# Patient Record
Sex: Male | Born: 1980 | Race: Black or African American | Hispanic: No | Marital: Single | Smoking: Current every day smoker
Health system: Southern US, Community
[De-identification: ages and names within clinical notes are randomized; demographics above are authoritative.]

## PROBLEM LIST (undated history)

## (undated) DIAGNOSIS — M5136 Other intervertebral disc degeneration, lumbar region: Secondary | ICD-10-CM

## (undated) DIAGNOSIS — F17200 Nicotine dependence, unspecified, uncomplicated: Secondary | ICD-10-CM

## (undated) DIAGNOSIS — G709 Myoneural disorder, unspecified: Secondary | ICD-10-CM

## (undated) DIAGNOSIS — F1410 Cocaine abuse, uncomplicated: Secondary | ICD-10-CM

## (undated) DIAGNOSIS — F1210 Cannabis abuse, uncomplicated: Secondary | ICD-10-CM

## (undated) DIAGNOSIS — M51369 Other intervertebral disc degeneration, lumbar region without mention of lumbar back pain or lower extremity pain: Secondary | ICD-10-CM

## (undated) DIAGNOSIS — E669 Obesity, unspecified: Secondary | ICD-10-CM

## (undated) DIAGNOSIS — I10 Essential (primary) hypertension: Secondary | ICD-10-CM

## (undated) DIAGNOSIS — E119 Type 2 diabetes mellitus without complications: Secondary | ICD-10-CM

## (undated) DIAGNOSIS — K219 Gastro-esophageal reflux disease without esophagitis: Secondary | ICD-10-CM

## (undated) DIAGNOSIS — E785 Hyperlipidemia, unspecified: Secondary | ICD-10-CM

## (undated) HISTORY — DX: Cocaine abuse, uncomplicated: F14.10

## (undated) HISTORY — DX: Gastro-esophageal reflux disease without esophagitis: K21.9

## (undated) HISTORY — DX: Hyperlipidemia, unspecified: E78.5

## (undated) HISTORY — DX: Myoneural disorder, unspecified: G70.9

## (undated) HISTORY — DX: Other intervertebral disc degeneration, lumbar region without mention of lumbar back pain or lower extremity pain: M51.369

## (undated) HISTORY — DX: Cannabis abuse, uncomplicated: F12.10

## (undated) HISTORY — DX: Nicotine dependence, unspecified, uncomplicated: F17.200

## (undated) HISTORY — PX: NO PAST SURGERIES: SHX2092

## (undated) HISTORY — DX: Obesity, unspecified: E66.9

## (undated) HISTORY — DX: Other intervertebral disc degeneration, lumbar region: M51.36

---

## 2001-01-24 ENCOUNTER — Emergency Department (HOSPITAL_COMMUNITY): Admission: EM | Admit: 2001-01-24 | Discharge: 2001-01-24 | Payer: Self-pay | Admitting: *Deleted

## 2003-03-01 ENCOUNTER — Encounter: Payer: Self-pay | Admitting: Emergency Medicine

## 2003-03-02 ENCOUNTER — Inpatient Hospital Stay (HOSPITAL_COMMUNITY): Admission: RE | Admit: 2003-03-02 | Discharge: 2003-03-03 | Payer: Self-pay | Admitting: *Deleted

## 2004-12-12 ENCOUNTER — Emergency Department (HOSPITAL_COMMUNITY): Admission: EM | Admit: 2004-12-12 | Discharge: 2004-12-12 | Payer: Self-pay | Admitting: Emergency Medicine

## 2007-01-31 ENCOUNTER — Emergency Department (HOSPITAL_COMMUNITY): Admission: EM | Admit: 2007-01-31 | Discharge: 2007-01-31 | Payer: Self-pay | Admitting: Emergency Medicine

## 2007-02-15 ENCOUNTER — Emergency Department (HOSPITAL_COMMUNITY): Admission: EM | Admit: 2007-02-15 | Discharge: 2007-02-15 | Payer: Self-pay | Admitting: Emergency Medicine

## 2007-08-27 ENCOUNTER — Emergency Department (HOSPITAL_COMMUNITY): Admission: EM | Admit: 2007-08-27 | Discharge: 2007-08-27 | Payer: Self-pay | Admitting: Emergency Medicine

## 2009-03-25 ENCOUNTER — Emergency Department (HOSPITAL_COMMUNITY): Admission: EM | Admit: 2009-03-25 | Discharge: 2009-03-25 | Payer: Self-pay | Admitting: Family Medicine

## 2010-08-13 NOTE — H&P (Signed)
Michael Bauer, Michael Bauer                        ACCOUNT NO.:  [id number]   MEDICAL RECORD NO.:  [id number]                   PATIENT TYPE:  OBV   LOCATION:  1824                                 FACILITY:  [facility]   PHYSICIAN:  Luis A. Delaney Meigs, M.D.                DATE OF BIRTH:  [birth year]   DATE OF ADMISSION:  03/01/2003  DATE OF DISCHARGE:                                HISTORY & PHYSICAL   CHIEF COMPLAINT:  Chest pain.   HISTORY OF PRESENT ILLNESS:  This is a 30 year old, African-American male  without any known past medical history, who presents with a several days  complaint of left-sided chest pain. He characterizes this as severe and  exacerbated by deep breathing. The patient also complains of fever.  At the  time of this interview, the patient is extremely somnolent after having  received Diazepam and Vicodin.   PAST MEDICAL HISTORY:  None.   PAST SURGICAL HISTORY:  Negative.   ALLERGIES:  No known drug allergies.   CURRENT MEDICATIONS:  None.   FAMILY MEDICAL HISTORY:  Noncontributory.   SOCIAL HISTORY:  The patient is unemployed.  Admits to smoking marijuana.  Denies any intravenous drug use.  Admits to occasional tobacco and alcohol  use.   PHYSICAL EXAMINATION:  GENERAL: This is an extremely somnolent, mildly  obese, African- American male, who appears diaphoretic and in mild distress.  VITAL SIGNS: T-max of 104.4 to currently 98.3, blood pressure of 84/53,  heart rate of 112.  HEENT: Pupils were equal and reactive to light. Extraocular muscles intact.  Oral examination is unremarkable.  NECK: Without JVD, carotid bruits, neck masses, or lymphadenopathy.  CARDIOVASCULAR: S1 and S2 with tachycardic heart sounds. No murmurs, rubs,  or gallops are auscultated.  LUNGS: Decreased breath sounds in the lower half of the left lung field.  ABDOMEN: Protuberant, moderately tender to deep palpation without masses or  visceromegaly palpable.  EXTREMITIES: Without clubbing,  cyanosis, or edema.  NEUROLOGIC: Nonfocal. A somnolent but oriented patient.   DIAGNOSTIC STUDIES:  Report of chest x-ray revealed left lower lobe  infiltrate (film is missing and is not available for review at this time).  CBC reveals white count of 34, hemoglobin 13.4, hematocrit 39.3, platelet  count 406,000 with greater than 20% bands, 94% neutrophils. BNP is entirely  within normal limits. LFTs are entirely within normal limits.   IMPRESSION AND PLAN:  A 30 year old [place] American male, otherwise  healthy, who presents with likely community-acquired pneumonia. Because of  the patient's hypotension, will place him at 23-hour observation in the  hospital with intravenous fluid repletion and intravenous antimicrobials,  i.e., ceftriaxone and azithromycin. Blood cultures and sputum cultures will  also be obtained.  Luis A. Delaney Meigs, M.D.    LAT/MEDQ  D:  03/02/2003  T:  03/02/2003  Job:  [id number]

## 2010-08-13 NOTE — Discharge Summary (Signed)
[name], Michael Bauer                        ACCOUNT NO.:  [id number]   MEDICAL RECORD NO.:  [id number]                   PATIENT TYPE:  INP   LOCATION:  0359                                 FACILITY:  [facility]   PHYSICIAN:  Hettie Holstein, D.O.                 DATE OF BIRTH:  02-04-1981   DATE OF ADMISSION:  03/02/2003  DATE OF DISCHARGE:  03/03/2003                                 DISCHARGE SUMMARY   ADMITTING PHYSICIAN:  Luis A. Delaney Meigs, M.D.   ADMISSION DIAGNOSES:  1. Left lower lobe community-acquired pneumonia.  2. Left lower lobe infiltrate.  3. Febrile illness.   DISCHARGE DIAGNOSES:  1. Left lower lobe community-acquired pneumonia.  2. Left lower lobe infiltrate.  3. Febrile illness.  4. Anemia, microcytic.  Iron studies pending at the time of discharge.  5. Tobacco dependence.  6. Cardiomegaly radiographically.   Radiologist recommendations for follow-up chest x-ray upon clearing of  pneumonic process.  Recommendations for follow-up include as above, chest x-  ray, and CBC as the patient had a WBC count of 34,000 on admission.  It did  trend down but at the date of discharge was 19.  He was afebrile,  comfortable, and symptoms were improved.   HISTORY OF PRESENTING ILLNESS:  As dictated by Dr. Jonetta Speak A. Delaney Meigs, this is a  30 year old African-American male without any known past medical history who  presented with several days' complaint of left-sided chest pain.  He  characterized this as severe and exacerbated by deep breathing.  The patient  also complains of fever.  At the time of interview the patient was extremely  somnolent after having been given Valium in the emergency department.   HOSPITAL COURSE:  The patient was admitted, started on IV Rocephin,  Zithromax.  Developed some diarrhea that was transient.  He was followed  closely and symptoms resolved.  He did have some minimal discomfort of  pleuritic-type nature.  Imaging was reported as follows:  He had a PA  and  lateral chest x-ray that revealed cardiomegaly, mild central pulmonary  vascular prominence, left lower lobe infiltrate, and recommended the patient  for follow-up of chest x-ray until clearance to exclude underlying  malignancy.  The patient clinically improved.  At this time he is being set  up for follow-up with his primary care physician, Dr. Shaune Pollack, telephone  number [phone], on March 17, 2003, at 11:10 a.m.  Again, it is  recommended he undergo repeat chest x-ray within the next couple of weeks  for reevaluation and a comparison upon resolution of his chest x-ray.   LABORATORY DATA THIS ADMISSION:  Pending iron studies.  WBC count on  discharge 19,000, hemoglobin 12.1, hematocrit 35.5.  Reticulocyte count 0.8.  Blood cultures were performed and are so far negative.  Urinalysis was  negative as well.  Comprehensive metabolic panel revealed sodium, this was  on admission, of 130, potassium  3.4, glucose of 106.  LFTs were all within  normal limits.                                                Hettie Holstein, D.O.    ESS/MEDQ  D:  03/03/2003  T:  03/04/2003  Job:  [id number]   cc:   Duncan Dull, M.D.  [address]  [place]  [place] [address]  Fax: [phone]

## 2010-12-23 LAB — GC/CHLAMYDIA PROBE AMP, GENITAL
Chlamydia, DNA Probe: NEGATIVE
GC Probe Amp, Genital: NEGATIVE

## 2010-12-23 LAB — RPR: RPR Ser Ql: NONREACTIVE

## 2011-01-04 LAB — GC/CHLAMYDIA PROBE AMP, GENITAL
Chlamydia, DNA Probe: POSITIVE — AB
GC Probe Amp, Genital: NEGATIVE

## 2016-08-28 ENCOUNTER — Encounter (HOSPITAL_COMMUNITY): Payer: Self-pay

## 2016-08-28 ENCOUNTER — Emergency Department (HOSPITAL_COMMUNITY)
Admission: EM | Admit: 2016-08-28 | Discharge: 2016-08-28 | Disposition: A | Discharge status: Home / Self Care | Payer: Self-pay | Attending: Emergency Medicine | Admitting: Emergency Medicine

## 2016-08-28 DIAGNOSIS — M546 Pain in thoracic spine: Secondary | ICD-10-CM

## 2016-08-28 DIAGNOSIS — F17200 Nicotine dependence, unspecified, uncomplicated: Secondary | ICD-10-CM | POA: Insufficient documentation

## 2016-08-28 DIAGNOSIS — M545 Low back pain: Secondary | ICD-10-CM | POA: Insufficient documentation

## 2016-08-28 DIAGNOSIS — G8929 Other chronic pain: Secondary | ICD-10-CM | POA: Insufficient documentation

## 2016-08-28 DIAGNOSIS — Z79899 Other long term (current) drug therapy: Secondary | ICD-10-CM | POA: Insufficient documentation

## 2016-08-28 MED ORDER — MELOXICAM 7.5 MG PO TABS: 7.5000 mg | ORAL_TABLET | Freq: Every day | ORAL | 0 refills | Status: DC

## 2016-08-28 MED ORDER — CYCLOBENZAPRINE HCL 10 MG PO TABS: 10.0000 mg | ORAL_TABLET | Freq: Every evening | ORAL | 0 refills | Status: DC | PRN

## 2016-08-28 NOTE — ED Notes (Signed)
Declined W/C at D/C and was escorted to lobby by RN. 

## 2016-08-28 NOTE — ED Provider Notes (Signed)
[facility]-EMERGENCY DEPT Provider Note   CSN: [id number] Arrival date & time: 08/28/16  1191  By signing my name below, I, Rosana Fret, attest that this documentation has been prepared under the direction and in the presence of non-physician practitioner, Russo, [place], PA-C. Electronically Signed: Rosana Fret, ED Scribe. 08/28/16. 11:06 AM.  History   Chief Complaint Chief Complaint  Patient presents with  . Back Pain   The history is provided by the patient. No language interpreter was used.   HPI Comments: Michael Bauer is a 36 y.o. male who presents to the Emergency Department complaining of chronic back pain that became worse 2 days ago. No injury. Per pt, his pain radiates from his mid back to his lower back. Pt describes pain as a constant, moderate aching. No modifying factors. No treatments tried prior to arrival in the ED. Pt denies hx of CA or GI bleed. Pt denies IV drug use. Pt denies fever, numbness, tingling,  Bowel/bladder incontinence, fever or any other complaints at this time.  Health, [facility]  History reviewed. No pertinent past medical history.  There are no active problems to display for this patient.   History reviewed. No pertinent surgical history.     Home Medications    Prior to Admission medications   Medication Sig Start Date End Date Taking? Authorizing Provider  cyclobenzaprine (FLEXERIL) 10 MG tablet Take 1 tablet (10 mg total) by mouth at bedtime as needed for muscle spasms. 08/28/16   Russo, [place] N, PA-C  meloxicam (MOBIC) 7.5 MG tablet Take 1 tablet (7.5 mg total) by mouth daily. 08/28/16   Russo, [place] N, PA-C    Family History No family history on file.  Social History Social History  Substance Use Topics  . Smoking status: Current Every Day Smoker  . Smokeless tobacco: Never Used  . Alcohol use Yes     Comment: Occasionally      Allergies   Patient has no known allergies.   Review of  Systems Review of Systems  Constitutional: Negative for fever.  Gastrointestinal:       No bowel incontinence  Genitourinary: Negative for difficulty urinating.  Musculoskeletal: Positive for back pain.  Neurological: Negative for weakness and numbness.     Physical Exam Updated Vital Signs BP (!) 160/109 (BP Location: Left Arm)   Pulse 73   Temp 98 F (36.7 C) (Oral)   Resp 19   Ht 5\' 4"  (1.626 m)   Wt 90.4 kg (199 lb 5 oz)   SpO2 99%   BMI 34.21 kg/m   Physical Exam  Constitutional: He is oriented to person, place, and time. He appears well-developed and well-nourished.  HENT:  Head: Normocephalic and atraumatic.  Eyes: Conjunctivae are normal.  Cardiovascular: Normal rate.   Pulmonary/Chest: Effort normal.  Musculoskeletal: Normal range of motion. He exhibits tenderness. He exhibits no deformity.  Muscular tenderness in upper and lower T spine. No spinal tenderness. Normal ROM. No bony step-offs. No gross deformities.   Neurological: He is alert and oriented to person, place, and time. No sensory deficit. Coordination normal.  Normal gait. 5/5 strength in all four extremities. Normal sensation. Intact distal pulses.   Psychiatric: He has a normal mood and affect. His behavior is normal.  Nursing note and vitals reviewed.    ED Treatments / Results  DIAGNOSTIC STUDIES: Oxygen Saturation is 94% on RA, adequate by my interpretation.   COORDINATION OF CARE: 10:53 AM-Discussed next steps with pt including treatment  at home with antiinflammatories and use of a muscle relaxer. Pt verbalized understanding and is agreeable with the plan.   Labs (all labs ordered are listed, but only abnormal results are displayed) Labs Reviewed - No data to display  EKG  EKG Interpretation None       Radiology No results found.  Procedures Procedures (including critical care time)  Medications Ordered in ED Medications - No data to display   Initial Impression /  Assessment and Plan / ED Course  I have reviewed the triage vital signs and the nursing notes.  Pertinent labs & imaging results that were available during my care of the patient were reviewed by me and considered in my medical decision making (see chart for details).      Patient with chronic back pain.  No neurological deficits and normal neuro exam.  Patient is ambulatory.  No loss of bowel or bladder control.  No concern for cauda equina.  No fever, night sweats, weight loss, h/o cancer, IVDA, no recent procedure to back. No urinary symptoms suggestive of UTI.  Supportive care and return precaution discussed. Appears safe for discharge at this time. Referral given to establish primary care, and follow-up as needed. Follow up as indicated in discharge paperwork.   Discussed results, findings, treatment and follow up. Patient advised of return precautions. Patient verbalized understanding and agreed with plan.  Final Clinical Impressions(s) / ED Diagnoses   Final diagnoses:  Chronic bilateral thoracic back pain    New Prescriptions Discharge Medication List as of 08/28/2016 11:08 AM    START taking these medications   Details  cyclobenzaprine (FLEXERIL) 10 MG tablet Take 1 tablet (10 mg total) by mouth at bedtime as needed for muscle spasms., Starting Sun 08/28/2016, Print    meloxicam (MOBIC) 7.5 MG tablet Take 1 tablet (7.5 mg total) by mouth daily., Starting Sun 08/28/2016, Print       I personally performed the services described in this documentation, which was scribed in my presence. The recorded information has been reviewed and is accurate.     Russo, [place] N, PA-C 08/28/16 1745    Bethann BerkshireZammit, Joseph, MD 08/29/16 [id number]

## 2016-08-28 NOTE — Discharge Instructions (Signed)
Please read instructions below.  Schedule an [facility] to establish primary care and follow up on your chronic back pain.  You can take meloxicam once daily, with food, as needed for pain.  You can take flexeril (cyclobenzaprine) at night as needed for muscle spasm. Do not drive or drink alcohol with this medication. Return to ER if new numbness or tingling in your arms or legs, inability to urinate, inability to hold your bowels, or weakness in your extremities.

## 2016-08-28 NOTE — ED Notes (Addendum)
Informed Pt of discharge BP. Pt asked if it was elevated, and this EMT explained to Pt that it was elevated. Pt stated he believed my dinamap was malfunctioning. This EMT offered to repeat BP measurement, to which Pt stated "I don't want none of that". Pt stated he also did not want BP medications before leaving. Nurse informed.

## 2016-08-28 NOTE — ED Triage Notes (Signed)
Per Pt, Pt is coming from home with complaints of lower back pain that has been going on for one year. Pt reports stretching makes it better at times. Pt denies change in urination.

## 2017-05-18 ENCOUNTER — Inpatient Hospital Stay (HOSPITAL_COMMUNITY)
Admission: EM | Admit: 2017-05-18 | Discharge: 2017-05-20 | DRG: 871 | Disposition: A | Discharge status: Home / Self Care | Payer: Self-pay | Attending: Pulmonary Disease | Admitting: Pulmonary Disease

## 2017-05-18 ENCOUNTER — Emergency Department (HOSPITAL_COMMUNITY): Payer: Self-pay

## 2017-05-18 ENCOUNTER — Encounter (HOSPITAL_COMMUNITY): Payer: Self-pay | Admitting: Emergency Medicine

## 2017-05-18 ENCOUNTER — Inpatient Hospital Stay (HOSPITAL_COMMUNITY): Payer: Self-pay

## 2017-05-18 DIAGNOSIS — E872 Acidosis: Secondary | ICD-10-CM | POA: Diagnosis present

## 2017-05-18 DIAGNOSIS — E669 Obesity, unspecified: Secondary | ICD-10-CM | POA: Diagnosis present

## 2017-05-18 DIAGNOSIS — Z6838 Body mass index (BMI) 38.0-38.9, adult: Secondary | ICD-10-CM

## 2017-05-18 DIAGNOSIS — A419 Sepsis, unspecified organism: Principal | ICD-10-CM | POA: Diagnosis present

## 2017-05-18 DIAGNOSIS — F17200 Nicotine dependence, unspecified, uncomplicated: Secondary | ICD-10-CM | POA: Diagnosis present

## 2017-05-18 DIAGNOSIS — G9340 Encephalopathy, unspecified: Secondary | ICD-10-CM | POA: Diagnosis present

## 2017-05-18 DIAGNOSIS — N179 Acute kidney failure, unspecified: Secondary | ICD-10-CM

## 2017-05-18 DIAGNOSIS — R739 Hyperglycemia, unspecified: Secondary | ICD-10-CM

## 2017-05-18 DIAGNOSIS — R402432 Glasgow coma scale score 3-8, at arrival to emergency department: Secondary | ICD-10-CM | POA: Diagnosis present

## 2017-05-18 DIAGNOSIS — R6521 Severe sepsis with septic shock: Secondary | ICD-10-CM | POA: Diagnosis present

## 2017-05-18 DIAGNOSIS — E875 Hyperkalemia: Secondary | ICD-10-CM | POA: Diagnosis present

## 2017-05-18 DIAGNOSIS — Z79899 Other long term (current) drug therapy: Secondary | ICD-10-CM

## 2017-05-18 DIAGNOSIS — G92 Toxic encephalopathy: Secondary | ICD-10-CM | POA: Diagnosis present

## 2017-05-18 DIAGNOSIS — I517 Cardiomegaly: Secondary | ICD-10-CM

## 2017-05-18 DIAGNOSIS — J690 Pneumonitis due to inhalation of food and vomit: Secondary | ICD-10-CM

## 2017-05-18 DIAGNOSIS — Z791 Long term (current) use of non-steroidal anti-inflammatories (NSAID): Secondary | ICD-10-CM

## 2017-05-18 DIAGNOSIS — J9601 Acute respiratory failure with hypoxia: Secondary | ICD-10-CM

## 2017-05-18 DIAGNOSIS — F1910 Other psychoactive substance abuse, uncomplicated: Secondary | ICD-10-CM | POA: Diagnosis present

## 2017-05-18 LAB — ECHOCARDIOGRAM COMPLETE
Ao-asc: 29 cm
CHL CUP DOP CALC LVOT VTI: 14 cm
CHL CUP TV REG PEAK VELOCITY: 176 cm/s
E/e' ratio: 5.48
EWDT: 204 ms
FS: 29 % (ref 28–44)
HEIGHTINCHES: 70 in
IVS/LV PW RATIO, ED: 1.23
LA ID, A-P, ES: 32 mm
LA diam end sys: 32 mm
LA diam index: 1.36 cm/m2
LA vol A4C: 34.9 ml
LA vol index: 15.4 mL/m2
LAVOL: 36.4 mL
LDCA: 4.15 cm2
LV E/e' medial: 5.48
LV E/e'average: 5.48
LV SIMPSON'S DISK: 53
LV e' LATERAL: 15.5 cm/s
LV sys vol index: 22 mL/m2
LVDIAVOL: 110 mL (ref 62–150)
LVDIAVOLIN: 47 mL/m2
LVOT SV: 58 mL
LVOT diameter: 23 mm
LVOTPV: 76.8 cm/s
LVSYSVOL: 51 mL (ref 21–61)
Lateral S' vel: 12.9 cm/s
MV Dec: 204
MV Peak grad: 3 mmHg
MV pk A vel: 60.1 m/s
MV pk E vel: 84.9 m/s
PW: 10.4 mm — AB (ref 0.6–1.1)
RV sys press: 15 mmHg
Stroke v: 59 ml
TAPSE: 22 mm
TDI e' lateral: 15.5
TDI e' medial: 7.71
TR max vel: 176 cm/s
Weight: 4271.63 oz

## 2017-05-18 LAB — CBC
HEMATOCRIT: 40.8 % (ref 39.0–52.0)
HEMATOCRIT: 35 % — AB (ref 39.0–52.0)
HEMOGLOBIN: 12 g/dL — AB (ref 13.0–17.0)
HEMOGLOBIN: 11 g/dL — AB (ref 13.0–17.0)
MCH: 26.9 pg (ref 26.0–34.0)
MCH: 27.3 pg (ref 26.0–34.0)
MCHC: 29.4 g/dL — ABNORMAL LOW (ref 30.0–36.0)
MCHC: 31.4 g/dL (ref 30.0–36.0)
MCV: 91.5 fL (ref 78.0–100.0)
MCV: 86.8 fL (ref 78.0–100.0)
Platelets: 429 10*3/uL — ABNORMAL HIGH (ref 150–400)
Platelets: 361 10*3/uL (ref 150–400)
RBC: 4.46 MIL/uL (ref 4.22–5.81)
RBC: 4.03 MIL/uL — ABNORMAL LOW (ref 4.22–5.81)
RDW: 13.6 % (ref 11.5–15.5)
RDW: 14 % (ref 11.5–15.5)
WBC: 28.6 10*3/uL — AB (ref 4.0–10.5)
WBC: 22.8 10*3/uL — AB (ref 4.0–10.5)

## 2017-05-18 LAB — GLUCOSE, CAPILLARY
GLUCOSE-CAPILLARY: 84 mg/dL (ref 65–99)
Glucose-Capillary: 91 mg/dL (ref 65–99)

## 2017-05-18 LAB — BASIC METABOLIC PANEL
ANION GAP: 9 (ref 5–15)
BUN: 15 mg/dL (ref 6–20)
CALCIUM: 7.8 mg/dL — AB (ref 8.9–10.3)
CO2: 22 mmol/L (ref 22–32)
Chloride: 106 mmol/L (ref 101–111)
Creatinine, Ser: 2.04 mg/dL — ABNORMAL HIGH (ref 0.61–1.24)
GFR calc Af Amer: 47 mL/min — ABNORMAL LOW (ref >60)
GFR calc non Af Amer: 41 mL/min — ABNORMAL LOW (ref >60)
GLUCOSE: 206 mg/dL — AB (ref 65–99)
Potassium: 5 mmol/L (ref 3.5–5.1)
Sodium: 137 mmol/L (ref 135–145)

## 2017-05-18 LAB — RAPID URINE DRUG SCREEN, HOSP PERFORMED
AMPHETAMINES: NOT DETECTED
BARBITURATES: NOT DETECTED
BENZODIAZEPINES: POSITIVE — AB
COCAINE: POSITIVE — AB
Opiates: NOT DETECTED
Tetrahydrocannabinol: POSITIVE — AB

## 2017-05-18 LAB — POCT I-STAT 3, ART BLOOD GAS (G3+)
Acid-base deficit: 8 mmol/L — ABNORMAL HIGH (ref 0.0–2.0)
Acid-base deficit: 3 mmol/L — ABNORMAL HIGH (ref 0.0–2.0)
Acid-base deficit: 1 mmol/L (ref 0.0–2.0)
BICARBONATE: 24 mmol/L (ref 20.0–28.0)
Bicarbonate: 21.2 mmol/L (ref 20.0–28.0)
Bicarbonate: 24.8 mmol/L (ref 20.0–28.0)
O2 SAT: 98 %
O2 Saturation: 97 %
O2 Saturation: 98 %
PCO2 ART: 57.1 mmHg — AB (ref 32.0–48.0)
PCO2 ART: 39.6 mmHg (ref 32.0–48.0)
PH ART: 7.175 — AB (ref 7.350–7.450)
PO2 ART: 115 mmHg — AB (ref 83.0–108.0)
Patient temperature: 97.9
Patient temperature: 36.7
Patient temperature: 98.8
TCO2: 23 mmol/L (ref 22–32)
TCO2: 26 mmol/L (ref 22–32)
TCO2: 25 mmol/L (ref 22–32)
pCO2 arterial: 54.6 mmHg — ABNORMAL HIGH (ref 32.0–48.0)
pH, Arterial: 7.264 — ABNORMAL LOW (ref 7.350–7.450)
pH, Arterial: 7.392 (ref 7.350–7.450)
pO2, Arterial: 114 mmHg — ABNORMAL HIGH (ref 83.0–108.0)
pO2, Arterial: 122 mmHg — ABNORMAL HIGH (ref 83.0–108.0)

## 2017-05-18 LAB — URINALYSIS, ROUTINE W REFLEX MICROSCOPIC
Bilirubin Urine: NEGATIVE
Glucose, UA: 150 mg/dL — AB
KETONES UR: NEGATIVE mg/dL
Leukocytes, UA: NEGATIVE
Nitrite: NEGATIVE
PH: 5 (ref 5.0–8.0)
PROTEIN: 100 mg/dL — AB
SQUAMOUS EPITHELIAL / LPF: NONE SEEN
Specific Gravity, Urine: 1.014 (ref 1.005–1.030)

## 2017-05-18 LAB — I-STAT ARTERIAL BLOOD GAS, ED
ACID-BASE DEFICIT: 7 mmol/L — AB (ref 0.0–2.0)
Bicarbonate: 22.5 mmol/L (ref 20.0–28.0)
O2 Saturation: 93 %
TCO2: 24 mmol/L (ref 22–32)
pCO2 arterial: 61.8 mmHg — ABNORMAL HIGH (ref 32.0–48.0)
pH, Arterial: 7.167 — CL (ref 7.350–7.450)
pO2, Arterial: 85 mmHg (ref 83.0–108.0)

## 2017-05-18 LAB — COMPREHENSIVE METABOLIC PANEL
ALBUMIN: 3.2 g/dL — AB (ref 3.5–5.0)
ALK PHOS: 58 U/L (ref 38–126)
ALT: 24 U/L (ref 17–63)
AST: 47 U/L — AB (ref 15–41)
Anion gap: 22 — ABNORMAL HIGH (ref 5–15)
BUN: 15 mg/dL (ref 6–20)
CALCIUM: 8.8 mg/dL — AB (ref 8.9–10.3)
CO2: 14 mmol/L — ABNORMAL LOW (ref 22–32)
CREATININE: 2.18 mg/dL — AB (ref 0.61–1.24)
Chloride: 102 mmol/L (ref 101–111)
GFR calc Af Amer: 43 mL/min — ABNORMAL LOW (ref >60)
GFR, EST NON AFRICAN AMERICAN: 37 mL/min — AB (ref >60)
GLUCOSE: 354 mg/dL — AB (ref 65–99)
Potassium: 5.7 mmol/L — ABNORMAL HIGH (ref 3.5–5.1)
Sodium: 138 mmol/L (ref 135–145)
TOTAL PROTEIN: 6.4 g/dL — AB (ref 6.5–8.1)
Total Bilirubin: 0.5 mg/dL (ref 0.3–1.2)

## 2017-05-18 LAB — I-STAT CG4 LACTIC ACID, ED
LACTIC ACID, VENOUS: 3.69 mmol/L — AB (ref 0.5–1.9)
Lactic Acid, Venous: 15.88 mmol/L (ref 0.5–1.9)

## 2017-05-18 LAB — CREATININE, SERUM
Creatinine, Ser: 2.1 mg/dL — ABNORMAL HIGH (ref 0.61–1.24)
GFR, EST AFRICAN AMERICAN: 45 mL/min — AB (ref >60)
GFR, EST NON AFRICAN AMERICAN: 39 mL/min — AB (ref >60)

## 2017-05-18 LAB — SALICYLATE LEVEL: Salicylate Lvl: <7 mg/dL (ref 2.8–30.0)

## 2017-05-18 LAB — ETHANOL: Alcohol, Ethyl (B): <10 mg/dL (ref <10)

## 2017-05-18 LAB — CBG MONITORING, ED: Glucose-Capillary: 148 mg/dL — ABNORMAL HIGH (ref 65–99)

## 2017-05-18 LAB — PROCALCITONIN

## 2017-05-18 LAB — LACTIC ACID, PLASMA
LACTIC ACID, VENOUS: 1.5 mmol/L (ref 0.5–1.9)
Lactic Acid, Venous: 1.9 mmol/L (ref 0.5–1.9)

## 2017-05-18 LAB — HIV ANTIBODY (ROUTINE TESTING W REFLEX): HIV Screen 4th Generation wRfx: NONREACTIVE

## 2017-05-18 LAB — CK: Total CK: 530 U/L — ABNORMAL HIGH (ref 49–397)

## 2017-05-18 LAB — MRSA PCR SCREENING: MRSA BY PCR: NEGATIVE

## 2017-05-18 LAB — ACETAMINOPHEN LEVEL

## 2017-05-18 MED ORDER — ORAL CARE MOUTH RINSE
15.0000 mL | Freq: Two times a day (BID) | OROMUCOSAL | Status: DC
  Administered 2017-05-18 (×2): 15 mL via OROMUCOSAL

## 2017-05-18 MED ORDER — PIPERACILLIN-TAZOBACTAM 3.375 G IVPB
3.3750 g | Freq: Three times a day (TID) | INTRAVENOUS | Status: DC
  Administered 2017-05-18 – 2017-05-19 (×5): 3.375 g via INTRAVENOUS
  Filled 2017-05-18 (×6): qty 50

## 2017-05-18 MED ORDER — FENTANYL CITRATE (PF) 100 MCG/2ML IJ SOLN
100.0000 ug | Freq: Once | INTRAMUSCULAR | Status: AC
  Administered 2017-05-18: 100 ug via INTRAVENOUS

## 2017-05-18 MED ORDER — MIDAZOLAM HCL 2 MG/2ML IJ SOLN
INTRAMUSCULAR | Status: AC
  Filled 2017-05-18: qty 4

## 2017-05-18 MED ORDER — SUCCINYLCHOLINE CHLORIDE 20 MG/ML IJ SOLN
100.0000 mg | Freq: Once | INTRAMUSCULAR | Status: AC
  Administered 2017-05-18: 100 mg via INTRAVENOUS

## 2017-05-18 MED ORDER — PROPOFOL 1000 MG/100ML IV EMUL
40.0000 ug/kg/min | Freq: Once | INTRAVENOUS | Status: AC
  Administered 2017-05-18: 40 ug/min via INTRAVENOUS

## 2017-05-18 MED ORDER — SODIUM CHLORIDE 0.9 % IV SOLN
INTRAVENOUS | Status: DC
  Administered 2017-05-18: 06:00:00 via INTRAVENOUS

## 2017-05-18 MED ORDER — FENTANYL BOLUS VIA INFUSION
50.0000 ug | INTRAVENOUS | Status: DC | PRN
  Administered 2017-05-18: 50 ug via INTRAVENOUS
  Filled 2017-05-18: qty 50

## 2017-05-18 MED ORDER — FENTANYL 2500MCG IN NS 250ML (10MCG/ML) PREMIX INFUSION: 25.0000 ug/h | INTRAVENOUS | Status: DC

## 2017-05-18 MED ORDER — LORAZEPAM 2 MG/ML IJ SOLN
1.0000 mg | INTRAMUSCULAR | Status: DC | PRN
  Administered 2017-05-18 – 2017-05-19 (×3): 1 mg via INTRAVENOUS
  Filled 2017-05-18 (×3): qty 1

## 2017-05-18 MED ORDER — HEPARIN SODIUM (PORCINE) 5000 UNIT/ML IJ SOLN
5000.0000 [IU] | Freq: Three times a day (TID) | INTRAMUSCULAR | Status: DC
  Administered 2017-05-18 – 2017-05-20 (×7): 5000 [IU] via SUBCUTANEOUS
  Filled 2017-05-18 (×7): qty 1

## 2017-05-18 MED ORDER — SODIUM CHLORIDE 0.9 % IV BOLUS (SEPSIS)
1000.0000 mL | Freq: Once | INTRAVENOUS | Status: AC
  Administered 2017-05-18: 1000 mL via INTRAVENOUS

## 2017-05-18 MED ORDER — SODIUM CHLORIDE 0.9 % IV SOLN: 250.0000 mL | INTRAVENOUS | Status: DC | PRN

## 2017-05-18 MED ORDER — SODIUM CHLORIDE 0.9 % IV SOLN
INTRAVENOUS | Status: DC
  Filled 2017-05-18: qty 1

## 2017-05-18 MED ORDER — MIDAZOLAM HCL 2 MG/2ML IJ SOLN: 2.0000 mg | Freq: Once | INTRAMUSCULAR | Status: DC

## 2017-05-18 MED ORDER — ETOMIDATE 2 MG/ML IV SOLN
20.0000 mg | Freq: Once | INTRAVENOUS | Status: AC
  Administered 2017-05-18: 20 mg via INTRAVENOUS

## 2017-05-18 MED ORDER — MIDAZOLAM HCL 2 MG/2ML IJ SOLN
4.0000 mg | Freq: Once | INTRAMUSCULAR | Status: AC
  Administered 2017-05-18: 4 mg via INTRAVENOUS

## 2017-05-18 MED ORDER — LORAZEPAM 2 MG/ML IJ SOLN: 1.0000 mg | INTRAMUSCULAR | Status: DC

## 2017-05-18 MED ORDER — PROPOFOL 1000 MG/100ML IV EMUL
INTRAVENOUS | Status: AC
  Administered 2017-05-18: 40 ug/min via INTRAVENOUS
  Filled 2017-05-18: qty 100

## 2017-05-18 MED ORDER — LACTATED RINGERS IV SOLN
INTRAVENOUS | Status: DC
Start: 2017-05-18 — End: 2017-05-20
  Administered 2017-05-18 – 2017-05-19 (×3): via INTRAVENOUS
  Filled 2017-05-18: qty 1000

## 2017-05-18 MED ORDER — DEXTROSE-NACL 5-0.45 % IV SOLN: INTRAVENOUS | Status: DC

## 2017-05-18 MED ORDER — PANTOPRAZOLE SODIUM 40 MG IV SOLR
40.0000 mg | Freq: Every day | INTRAVENOUS | Status: DC
  Administered 2017-05-18: 40 mg via INTRAVENOUS

## 2017-05-18 MED ORDER — PROPOFOL 10 MG/ML IV BOLUS
40.0000 mg | Freq: Once | INTRAVENOUS | Status: AC
  Administered 2017-05-18: 40 mg via INTRAVENOUS

## 2017-05-18 MED ORDER — FENTANYL CITRATE (PF) 100 MCG/2ML IJ SOLN
50.0000 ug | Freq: Once | INTRAMUSCULAR | Status: AC
  Administered 2017-05-18: 50 ug via INTRAVENOUS

## 2017-05-18 MED ORDER — THIAMINE HCL 100 MG/ML IJ SOLN
100.0000 mg | Freq: Every day | INTRAMUSCULAR | Status: DC
  Administered 2017-05-18 – 2017-05-19 (×2): 100 mg via INTRAVENOUS
  Filled 2017-05-18 (×2): qty 2

## 2017-05-18 MED ORDER — INSULIN REGULAR BOLUS VIA INFUSION
0.0000 [IU] | Freq: Three times a day (TID) | INTRAVENOUS | Status: DC
  Filled 2017-05-18: qty 10

## 2017-05-18 MED ORDER — FENTANYL 2500MCG IN NS 250ML (10MCG/ML) PREMIX INFUSION
0.0000 ug/h | INTRAVENOUS | Status: DC
  Administered 2017-05-18: 200 ug/h via INTRAVENOUS
  Administered 2017-05-18: 100 ug/h via INTRAVENOUS
  Administered 2017-05-19: 200 ug/h via INTRAVENOUS
  Filled 2017-05-18 (×3): qty 250

## 2017-05-18 MED ORDER — MIDAZOLAM HCL 2 MG/2ML IJ SOLN
INTRAMUSCULAR | Status: AC
  Administered 2017-05-18: 07:00:00
  Filled 2017-05-18: qty 2

## 2017-05-18 MED ORDER — FENTANYL CITRATE (PF) 100 MCG/2ML IJ SOLN
INTRAMUSCULAR | Status: AC
  Filled 2017-05-18: qty 2

## 2017-05-18 MED ORDER — PIPERACILLIN-TAZOBACTAM 3.375 G IVPB 30 MIN
3.3750 g | Freq: Once | INTRAVENOUS | Status: AC
  Administered 2017-05-18: 3.375 g via INTRAVENOUS
  Filled 2017-05-18: qty 50

## 2017-05-18 MED ORDER — DEXTROSE 50 % IV SOLN: 25.0000 mL | INTRAVENOUS | Status: DC | PRN

## 2017-05-18 MED ORDER — CHLORHEXIDINE GLUCONATE 0.12 % MT SOLN
15.0000 mL | Freq: Two times a day (BID) | OROMUCOSAL | Status: DC
  Administered 2017-05-18 – 2017-05-19 (×3): 15 mL via OROMUCOSAL
  Filled 2017-05-18 (×2): qty 15

## 2017-05-18 MED ORDER — PERFLUTREN LIPID MICROSPHERE
1.0000 mL | INTRAVENOUS | Status: AC | PRN
  Administered 2017-05-18: 4 mL via INTRAVENOUS
  Filled 2017-05-18: qty 10

## 2017-05-18 NOTE — ED Triage Notes (Addendum)
Patient arrived with EMS/[employer] officers from home , girlfriend called [employer] /EMS reported that pt. has been taking drugs ( unknown ) this evening , [employer] reported that pt. Was combative at scene he was tazed twice by the officers and received Versed 5 mg IV , unresponsive at arrival with NPA . O2 sat= 56% NRB mask.

## 2017-05-18 NOTE — ED Provider Notes (Signed)
[facility] EMERGENCY DEPARTMENT Provider Note   CSN: [id number] Arrival date & time: 05/18/17  4098     History   Chief Complaint Chief Complaint  Patient presents with  . Unresponsive   Level 5 caveat: Unresponsive  HPI Michael Bauer is a 37 y.o. male.  HPI Patient is a 37 year old male who reportedly was out with friends watching basketball and when he came home he told his wife he did not feel well.  He became increasingly confused and altered and somewhat hostile.  EMS was contacted.  Police were contacted.  Patient required taser x2.  On EMS arrival multiple police officers were holding the patient down.  He was extremely combative.  He was given 5 mg IM Versed.  In route to the emergency department he vomited and possibly aspirated.  On arrival to the emergency department the patient is found to be hypoxic down to 39% and obtunded.  He has a nasal airway in place.  No reported head injury.  Wife does report that he uses drugs including Molly on occasion.  Patient otherwise been in his normal state of health earlier today.  No recent illness.  History reviewed. No pertinent past medical history.  There are no active problems to display for this patient.   History reviewed. No pertinent surgical history.     Home Medications    Prior to Admission medications   Medication Sig Start Date End Date Taking? Authorizing Provider  cyclobenzaprine (FLEXERIL) 10 MG tablet Take 1 tablet (10 mg total) by mouth at bedtime as needed for muscle spasms. 08/28/16   Robinson, [place] N, PA-C  meloxicam (MOBIC) 7.5 MG tablet Take 1 tablet (7.5 mg total) by mouth daily. 08/28/16   Robinson, [place] N, PA-C    Family History No family history on file.  Social History Social History   Tobacco Use  . Smoking status: Current Every Day Smoker  . Smokeless tobacco: Never Used  Substance Use Topics  . Alcohol use: Yes    Comment: Occasionally   . Drug use: Yes   Types: Marijuana     Allergies   Patient has no known allergies.   Review of Systems Review of Systems  All other systems reviewed and are negative.    Physical Exam Updated Vital Signs BP (!) 69/40   Pulse (!) 113   Temp 97.8 F (36.6 C) (Temporal)   Resp (!) 25   Ht 5\' 10"  (1.778 m)   Wt 99.8 kg (220 lb)   SpO2 90%   BMI 31.57 kg/m   Physical Exam  Constitutional: He appears well-developed and well-nourished.  HENT:  Head: Normocephalic and atraumatic.  Vomit around his mouth  Eyes:  Pupils equal round reactive to light.  Neck: Neck supple.  Cardiovascular:  Tachycardia.  Regular rhythm.  Pulmonary/Chest:  Tachypnea.  Decreased breath sounds throughout his lung fields.  No wheezing  Abdominal: Soft. He exhibits no distension.  Genitourinary:  Genitourinary Comments: Normal external genitalia  Musculoskeletal: He exhibits no deformity.  Neurological:  GCS 3.  No gag present.  Skin: Skin is warm. No rash noted.  Psychiatric:  Unable to test  Nursing note and vitals reviewed.    ED Treatments / Results  Labs (all labs ordered are listed, but only abnormal results are displayed) Labs Reviewed  CBC - Abnormal; Notable for the following components:      Result Value   WBC 28.6 (*)    Hemoglobin 12.0 (*)  MCHC 29.4 (*)    Platelets 429 (*)    All other components within normal limits  COMPREHENSIVE METABOLIC PANEL - Abnormal; Notable for the following components:   Potassium 5.7 (*)    CO2 14 (*)    Glucose, Bld 354 (*)    Creatinine, Ser 2.18 (*)    Calcium 8.8 (*)    Total Protein 6.4 (*)    Albumin 3.2 (*)    AST 47 (*)    GFR calc non Af Amer 37 (*)    GFR calc Af Amer 43 (*)    Anion gap 22 (*)    All other components within normal limits  ACETAMINOPHEN LEVEL - Abnormal; Notable for the following components:   Acetaminophen (Tylenol), Serum <10 (*)    All other components within normal limits  I-STAT CG4 LACTIC ACID, ED - Abnormal;  Notable for the following components:   Lactic Acid, Venous 15.88 (*)    All other components within normal limits  I-STAT ARTERIAL BLOOD GAS, ED - Abnormal; Notable for the following components:   pH, Arterial 7.167 (*)    pCO2 arterial 61.8 (*)    Acid-base deficit 7.0 (*)    All other components within normal limits  ETHANOL  SALICYLATE LEVEL  RAPID URINE DRUG SCREEN, HOSP PERFORMED  URINALYSIS, ROUTINE W REFLEX MICROSCOPIC  BLOOD GAS, ARTERIAL    EKG  EKG Interpretation None       Radiology Ct Head Wo Contrast  Result Date: 05/18/2017 CLINICAL DATA:  Altered mental status, combative and tasered by police department twice. EXAM: CT HEAD WITHOUT CONTRAST TECHNIQUE: Contiguous axial images were obtained from the base of the skull through the vertex without intravenous contrast. COMPARISON:  None. FINDINGS: BRAIN: No intraparenchymal hemorrhage, mass effect nor midline shift. The ventricles and sulci are normal. No acute large vascular territory infarcts. No abnormal extra-axial fluid collections. Basal cisterns are patent. VASCULAR: Unremarkable. SKULL/SOFT TISSUES: No skull fracture. No significant soft tissue swelling. ORBITS/SINUSES: Proptosis.Lobulated paranasal sinus mucosal thickening. Mastoid air cells are well aerated. OTHER: Life-support lines in place. IMPRESSION: 1. Normal noncontrast CT HEAD. 2. Proptosis. Electronically Signed   By: Awilda Metroourtnay  Bloomer M.D.   On: 05/18/2017 04:10   Dg Chest Portable 1 View  Result Date: 05/18/2017 CLINICAL DATA:  Unresponsive EXAM: PORTABLE CHEST 1 VIEW COMPARISON:  Report 03/02/2003 FINDINGS: Endotracheal tube tip is about 2.5 cm superior to carina. Esophageal tube tip is below the diaphragm. Cardiomegaly. Streaky bibasilar opacity. No pleural effusion or pneumothorax. IMPRESSION: 1. Endotracheal tube tip about 2.5 cm superior to carina 2. Esophageal tube tip below the diaphragm but not included on the image number 3 bibasilar airspace  disease which may reflect atelectasis, pneumonia, or aspiration. Electronically Signed   By: Jasmine PangKim  Fujinaga M.D.   On: 05/18/2017 03:47    Procedures Date/Time: 05/18/2017 5:09 AM Performed by: Azalia Bilisampos, Cane Dubray, MD      INTUBATION Performed by: Azalia BilisKevin Charisa Twitty Required items: required blood products, implants, devices, and special equipment available Patient identity confirmed: provided demographic data and hospital-assigned identification number Time out: Immediately prior to procedure a "time out" was called to verify the correct patient, procedure, equipment, support staff and site/side marked as required. Indications: Respiratory failure Intubation method: Glidescope Laryngoscopy  Preoxygenation: BVM Sedatives: Etomidate Paralytic: Succinylcholine Tube Size: 7.5 cuffed Post-procedure assessment: chest rise and ETCO2 monitor Breath sounds: equal and absent over the epigastrium Tube secured with: ETT holder Chest x-ray interpreted by radiologist and me. Chest x-ray findings: endotracheal tube in  appropriate position Patient tolerated the procedure well with no immediate complications.   CRITICAL CARE Performed by: Azalia Bilis Total critical care time: 45 minutes Critical care time was exclusive of separately billable procedures and treating other patients. Critical care was necessary to treat or prevent imminent or life-threatening deterioration. Critical care was time spent personally by me on the following activities: development of treatment plan with patient and/or surrogate as well as nursing, discussions with consultants, evaluation of patient's response to treatment, examination of patient, obtaining history from patient or surrogate, ordering and performing treatments and interventions, ordering and review of laboratory studies, ordering and review of radiographic studies, pulse oximetry and re-evaluation of patient's condition.   Medications Ordered in ED Medications    sodium chloride 0.9 % bolus 1,000 mL (not administered)  etomidate (AMIDATE) injection 20 mg (20 mg Intravenous Given 05/18/17 0331)  succinylcholine (ANECTINE) injection 100 mg (100 mg Intravenous Given 05/18/17 0331)  sodium chloride 0.9 % bolus 1,000 mL (0 mLs Intravenous Stopped 05/18/17 0429)  midazolam (VERSED) injection 4 mg (4 mg Intravenous Given 05/18/17 0413)  fentaNYL (SUBLIMAZE) injection 100 mcg (100 mcg Intravenous Given 05/18/17 0413)  propofol (DIPRIVAN) 10 mg/mL bolus/IV push 40 mg (40 mg Intravenous Given 05/18/17 0415)  propofol (DIPRIVAN) 1000 MG/100ML infusion (10 mcg/kg/min  99.8 kg Intravenous Rate/Dose Change 05/18/17 0453)  piperacillin-tazobactam (ZOSYN) IVPB 3.375 g (0 g Intravenous Stopped 05/18/17 0453)  sodium chloride 0.9 % bolus 1,000 mL (1,000 mLs Intravenous New Bag/Given 05/18/17 0453)     Initial Impression / Assessment and Plan / ED Course  I have reviewed the triage vital signs and the nursing notes.  Pertinent labs & imaging results that were available during my care of the patient were reviewed by me and considered in my medical decision making (see chart for details).     Likely aspiration pneumonia.  Presenting hypoxic and in respiratory failure.  Intubated for airway protection and severe hypoxia.  IV Zosyn now.  Alcohol is negative.  Elevated lactate.  Anion gap 22.  Bicarb 14.  Hyperglycemia noted.  Best I can tell the patient has no prior history of diabetes.  I will verify this with family.  Initiate IV insulin now.  Additional IV fluids now.  Initially no sedation was required however now he appears to be moving all 4 extremities and shaking his head back and forth.  IV Versed and fentanyl given.  Started on a propofol drip.  Will need to recheck his lactate shortly.  I discussed the case with the pulmonary critical care team who will admit the patient to the intensive care unit and evaluate in the emergency department.  Final Clinical Impressions(s) /  ED Diagnoses   Final diagnoses:  Acute respiratory failure with hypoxia (HCC)  Aspiration pneumonia of both lower lobes due to vomit [facility])    ED Discharge Orders    None       Azalia Bilis, MD 05/18/17 [phone]

## 2017-05-18 NOTE — Progress Notes (Signed)
Transported pt to CT scan no events to report.

## 2017-05-18 NOTE — H&P (Signed)
Name: Michael Bauer MRN: [id number] DOB: 1980/09/21    ADMISSION DATE:  05/18/2017 CONSULTATION DATE: 2/21  REFERRING MD : Dr. Patria Mane EDP  CHIEF COMPLAINT: Altered mental status  HISTORY OF PRESENT ILLNESS: 37 year old male with past medical history of tobacco and substance abuse.  He presented to [facility] emergency department in the early a.m. hours of 2/21.  The night of 2/20 he went out with some friends and according to his girlfriend was drinking and participating in drug use.  It was not clear upon presentation which drugs he was using.  After returning home he was acting strange and became combative.  His girlfriend called EMS/police.  Upon their arrival he continued to be combative leading [employer] to utilize a taser twice.  He was then given Versed by EMS transported to the emergency department.  Upon arrival to the emergency department he was unresponsive and hypoxemic.  He was intubated for airway protection.  There was concern for aspiration noted while intubating.  He was started on sedative infusions and PCCM was asked to admit to ICU.  SIGNIFICANT EVENTS    STUDIES:  Head CT 2/21 > normal   PAST MEDICAL HISTORY :   has no past medical history on file.  has no past surgical history on file. Prior to Admission medications   Medication Sig Start Date End Date Taking? Authorizing Provider  cyclobenzaprine (FLEXERIL) 10 MG tablet Take 1 tablet (10 mg total) by mouth at bedtime as needed for muscle spasms. 08/28/16   Robinson, [place] N, PA-C  meloxicam (MOBIC) 7.5 MG tablet Take 1 tablet (7.5 mg total) by mouth daily. 08/28/16   Robinson, [place] N, PA-C   No Known Allergies  FAMILY HISTORY:  family history is not on file. SOCIAL HISTORY:  reports that he has been smoking.  he has never used smokeless tobacco. He reports that he drinks alcohol. He reports that he uses drugs. Drug: Marijuana.  REVIEW OF SYSTEMS:   unable  SUBJECTIVE:   VITAL  SIGNS: Temp:  [97.8 F (36.6 C)] 97.8 F (36.6 C) (02/21 0323) Pulse Rate:  [114-131] 120 (02/21 0430) Resp:  [14-27] 22 (02/21 0430) BP: (72-156)/(40-74) 72/40 (02/21 0430) SpO2:  [56 %-97 %] 95 % (02/21 0430) FiO2 (%):  [100 %] 100 % (02/21 0330) Weight:  [99.8 kg (220 lb)] 99.8 kg (220 lb) (02/21 0324)  PHYSICAL EXAMINATION: General:  Young adult male in NAD on vent Neuro:  Sedated, RASS -3. PERRL,  HEENT:  [place]/AT, No appreciable JVD Cardiovascular:  RRR, no MRG Lungs:  Coarse bilateral breath sounds Abdomen:  Soft, non-tender, no overt organomegaly.  Musculoskeletal:  No acute deformity.  Skin:  Taser puncture wounds x2 to L upper arm.   Recent Labs  Lab 05/18/17 0337  NA 138  K 5.7*  CL 102  CO2 14*  BUN 15  CREATININE 2.18*  GLUCOSE 354*   Recent Labs  Lab 05/18/17 0337  HGB 12.0*  HCT 40.8  WBC 28.6*  PLT 429*   Ct Head Wo Contrast  Result Date: 05/18/2017 CLINICAL DATA:  Altered mental status, combative and tasered by police department twice. EXAM: CT HEAD WITHOUT CONTRAST TECHNIQUE: Contiguous axial images were obtained from the base of the skull through the vertex without intravenous contrast. COMPARISON:  None. FINDINGS: BRAIN: No intraparenchymal hemorrhage, mass effect nor midline shift. The ventricles and sulci are normal. No acute large vascular territory infarcts. No abnormal extra-axial fluid collections. Basal cisterns are patent. VASCULAR: Unremarkable. SKULL/SOFT  TISSUES: No skull fracture. No significant soft tissue swelling. ORBITS/SINUSES: Proptosis.Lobulated paranasal sinus mucosal thickening. Mastoid air cells are well aerated. OTHER: Life-support lines in place. IMPRESSION: 1. Normal noncontrast CT HEAD. 2. Proptosis. Electronically Signed   By: Awilda Metroourtnay  Bloomer M.D.   On: 05/18/2017 04:10   Dg Chest Portable 1 View  Result Date: 05/18/2017 CLINICAL DATA:  Unresponsive EXAM: PORTABLE CHEST 1 VIEW COMPARISON:  Report 03/02/2003 FINDINGS:  Endotracheal tube tip is about 2.5 cm superior to carina. Esophageal tube tip is below the diaphragm. Cardiomegaly. Streaky bibasilar opacity. No pleural effusion or pneumothorax. IMPRESSION: 1. Endotracheal tube tip about 2.5 cm superior to carina 2. Esophageal tube tip below the diaphragm but not included on the image number 3 bibasilar airspace disease which may reflect atelectasis, pneumonia, or aspiration. Electronically Signed   By: Jasmine PangKim  Fujinaga M.D.   On: 05/18/2017 03:47    ASSESSMENT / PLAN:  Acute hypoxemic respiratory failure: likely due to aspiration event and lethargy. - Full vent support - ABG now - Follow CXR  Likely aspiration PNA - Continue IV zosyn - Follow cultures - Trend PCT  Lactic acidosis: likely due to combination of hypotension and taser use.  - Hydrate - Maintain MAP goal - Trend lactic acid.   AKI: - Hydrate - Follow BMP - Assess CK  Hypotension: BP was fine until intubated and placed on propofol - Cut back propofol, was on 40 mcg upon my arrival - May need to transition to fentanyl infusion.  - GIVE IVF hydration - May need pressors if does not improve with volume and sedative change  Acute metabolic/toxic encephalopathy: family reports he was abusing drugs tonight, although they are not clear what he was taking.  - UDS pending - Propofol for sedation with RASS goal -1 to -2  Hyperkalemia: drawn just after succinylcholine was given - Will need to repeat.   Joneen RoachPaul Hoffman, AGACNP-BC [facility] Pager [phone] or [phone]  05/18/2017 5:14 AM

## 2017-05-18 NOTE — Progress Notes (Signed)
  Echocardiogram 2D Echocardiogram has been performed.  Michael Bauer, Michael Bauer R 05/18/2017, 2:58 PM

## 2017-05-18 NOTE — Progress Notes (Signed)
Paged DR. Reported critical ABG ordered to changed rate to 20.

## 2017-05-18 NOTE — ED Notes (Addendum)
Dr. Patria Maneampos ( EDP ) explained tests results / admission and plan of care to pt.'s family at bedside , waiting for ICU MD for admission . Propofol drip/ Zosyn IV infusing , IV sites intact .

## 2017-05-18 NOTE — ED Notes (Signed)
NS IV boluses infusing , IV sites intact , condom cath intact , ETT/OGT intact . NP/Admitting MD at bedside .

## 2017-05-19 ENCOUNTER — Inpatient Hospital Stay (HOSPITAL_COMMUNITY): Payer: Self-pay

## 2017-05-19 DIAGNOSIS — R4182 Altered mental status, unspecified: Secondary | ICD-10-CM

## 2017-05-19 LAB — BLOOD GAS, ARTERIAL
ACID-BASE EXCESS: 0.7 mmol/L (ref 0.0–2.0)
BICARBONATE: 24.2 mmol/L (ref 20.0–28.0)
DRAWN BY: 2122119
FIO2: 40
LHR: 26 {breaths}/min
MECHVT: 560 mL
O2 Saturation: 97.8 %
PATIENT TEMPERATURE: 98.8
PCO2 ART: 35.3 mmHg (ref 32.0–48.0)
PEEP/CPAP: 5 cmH2O
PH ART: 7.452 — AB (ref 7.350–7.450)
PO2 ART: 105 mmHg (ref 83.0–108.0)

## 2017-05-19 LAB — CBC WITH DIFFERENTIAL/PLATELET
Basophils Absolute: 0.1 10*3/uL (ref 0.0–0.1)
Basophils Relative: 1 %
EOS ABS: 0.2 10*3/uL (ref 0.0–0.7)
EOS PCT: 2 %
HCT: 33.1 % — ABNORMAL LOW (ref 39.0–52.0)
HEMOGLOBIN: 10.5 g/dL — AB (ref 13.0–17.0)
LYMPHS ABS: 1.7 10*3/uL (ref 0.7–4.0)
LYMPHS PCT: 16 %
MCH: 27.1 pg (ref 26.0–34.0)
MCHC: 31.7 g/dL (ref 30.0–36.0)
MCV: 85.5 fL (ref 78.0–100.0)
Monocytes Absolute: 0.9 10*3/uL (ref 0.1–1.0)
Monocytes Relative: 8 %
Neutro Abs: 7.9 10*3/uL — ABNORMAL HIGH (ref 1.7–7.7)
Neutrophils Relative %: 73 %
PLATELETS: 336 10*3/uL (ref 150–400)
RBC: 3.87 MIL/uL — ABNORMAL LOW (ref 4.22–5.81)
RDW: 14.1 % (ref 11.5–15.5)
WBC: 10.6 10*3/uL — ABNORMAL HIGH (ref 4.0–10.5)

## 2017-05-19 LAB — GLUCOSE, CAPILLARY
GLUCOSE-CAPILLARY: 78 mg/dL (ref 65–99)
GLUCOSE-CAPILLARY: 90 mg/dL (ref 65–99)
Glucose-Capillary: 106 mg/dL — ABNORMAL HIGH (ref 65–99)
Glucose-Capillary: 190 mg/dL — ABNORMAL HIGH (ref 65–99)
Glucose-Capillary: 77 mg/dL (ref 65–99)
Glucose-Capillary: 85 mg/dL (ref 65–99)
Glucose-Capillary: 88 mg/dL (ref 65–99)

## 2017-05-19 LAB — COMPREHENSIVE METABOLIC PANEL
ALBUMIN: 3.1 g/dL — AB (ref 3.5–5.0)
ALK PHOS: 33 U/L — AB (ref 38–126)
ALT: 32 U/L (ref 17–63)
AST: 50 U/L — ABNORMAL HIGH (ref 15–41)
Anion gap: 10 (ref 5–15)
BUN: 14 mg/dL (ref 6–20)
CALCIUM: 8.8 mg/dL — AB (ref 8.9–10.3)
CO2: 23 mmol/L (ref 22–32)
CREATININE: 2.23 mg/dL — AB (ref 0.61–1.24)
Chloride: 112 mmol/L — ABNORMAL HIGH (ref 101–111)
GFR calc non Af Amer: 36 mL/min — ABNORMAL LOW (ref >60)
GFR, EST AFRICAN AMERICAN: 42 mL/min — AB (ref >60)
GLUCOSE: 83 mg/dL (ref 65–99)
Potassium: 3.8 mmol/L (ref 3.5–5.1)
SODIUM: 145 mmol/L (ref 135–145)
Total Bilirubin: 0.7 mg/dL (ref 0.3–1.2)
Total Protein: 6.1 g/dL — ABNORMAL LOW (ref 6.5–8.1)

## 2017-05-19 LAB — MAGNESIUM: MAGNESIUM: 2.5 mg/dL — AB (ref 1.7–2.4)

## 2017-05-19 LAB — PHOSPHORUS: Phosphorus: 2.4 mg/dL — ABNORMAL LOW (ref 2.5–4.6)

## 2017-05-19 MED ORDER — PANTOPRAZOLE SODIUM 40 MG PO TBEC
40.0000 mg | DELAYED_RELEASE_TABLET | Freq: Every day | ORAL | Status: DC
  Administered 2017-05-19: 40 mg via ORAL
  Filled 2017-05-19: qty 1

## 2017-05-19 MED ORDER — PANTOPRAZOLE SODIUM 40 MG PO PACK: 40.0000 mg | PACK | Freq: Every day | ORAL | Status: DC

## 2017-05-19 MED ORDER — LEVOFLOXACIN 750 MG PO TABS
750.0000 mg | ORAL_TABLET | ORAL | Status: DC
  Administered 2017-05-19: 750 mg via ORAL
  Filled 2017-05-19 (×2): qty 1

## 2017-05-19 MED ORDER — ORAL CARE MOUTH RINSE
15.0000 mL | OROMUCOSAL | Status: DC
  Administered 2017-05-19 (×4): 15 mL via OROMUCOSAL

## 2017-05-19 NOTE — Progress Notes (Signed)
PULMONARY / CRITICAL CARE MEDICINE   Name: Michael Bauer MRN: [id number] DOB: 08/20/1980    ADMISSION DATE:  05/18/2017    HISTORY OF PRESENT ILLNESS:       37 year old male with past medical history of tobacco and substance abuse.  He presented to [facility] emergency department in the early a.m. hours of 2/21.  The night of 2/20 he went out with some friends and according to his girlfriend was drinking and participating in drug use.  It was not clear upon presentation which drugs he was using.  After returning home he was acting strange and became combative.  His girlfriend called EMS/police.  Upon their arrival he continued to be combative leading [employer] to utilize a taser twice.  He was then given Versed by EMS transported to the emergency department.  Upon arrival to the emergency department he was unresponsive and hypoxemic.  He was intubated for airway protection.  There was concern for aspiration noted while intubating.  He was started on sedative infusions and PCCM was asked to admit to ICU.  Morning he is a little lethargic but interactive and nodding appropriately to questions.  He is tolerating a spontaneous breathing trial during my examination.  At the time of my examination he is on 200 mcg/h of fentanyl.     PAST MEDICAL HISTORY :  He  has no past medical history on file.  PAST SURGICAL HISTORY: He  has no past surgical history on file.  No Known Allergies  No current facility-administered medications on file prior to encounter.    Current Outpatient Medications on File Prior to Encounter  Medication Sig  . acetaminophen (TYLENOL) 500 MG tablet Take 1,000 mg by mouth every 6 (six) hours as needed for mild pain or headache.  . ibuprofen (ADVIL,MOTRIN) 200 MG tablet Take 400 mg by mouth every 6 (six) hours as needed for mild pain.    FAMILY HISTORY:  His has no family status information on file.    SOCIAL HISTORY: He  reports that he has  been smoking.  he has never used smokeless tobacco. He reports that he drinks alcohol. He reports that he uses drugs. Drug: Marijuana.  REVIEW OF SYSTEMS:   Not obtainable  SUBJECTIVE:  As above  VITAL SIGNS: BP 126/70   Pulse 73   Temp 98.8 F (37.1 C) (Axillary)   Resp (!) 26   Ht 5\' 10"  (1.778 m)   Wt 261 lb 11 oz (118.7 kg)   SpO2 100%   BMI 37.55 kg/m   HEMODYNAMICS:    VENTILATOR SETTINGS: Vent Mode: PRVC FiO2 (%):  [40 %-60 %] 40 % Set Rate:  [26 bmp] 26 bmp Vt Set:  [500 mL-560 mL] 560 mL PEEP:  [5 cmH20] 5 cmH20 Plateau Pressure:  [19 cmH20-22 cmH20] 19 cmH20  INTAKE / OUTPUT: I/O last 3 completed shifts: In: 8968.6 [I.V.:5818.6; IV Piggyback:3150] Out: 3350 [Urine:2300; Emesis/NG output:550; Other:500]  PHYSICAL EXAMINATION: General: He is currently calmly interactive and nodding to questions orally intubated and mechanically ventilated. Neuro: Pupils are equal and he is moving all fours Cardiovascular: S1 and S2 are somewhat distant and regular without murmur rub or gallop. Lungs: Abrasions are unlabored, there is symmetric air movement, no wheezes. Abdomen: The abdomen is somewhat obese soft and nontender Musculoskeletal: There is no dependent edema   LABS:  BMET Recent Labs  Lab 05/18/17 0337 05/18/17 0526 05/18/17 0554 05/19/17 0515  NA 138  --  137 145  K  5.7*  --  5.0 3.8  CL 102  --  106 112*  CO2 14*  --  22 23  BUN 15  --  15 14  CREATININE 2.18* 2.10* 2.04* 2.23*  GLUCOSE 354*  --  206* 83    Electrolytes Recent Labs  Lab 05/18/17 0337 05/18/17 0554 05/19/17 0515  CALCIUM 8.8* 7.8* 8.8*  MG  --   --  2.5*  PHOS  --   --  2.4*    CBC Recent Labs  Lab 05/18/17 0337 05/18/17 0733 05/19/17 0515  WBC 28.6* 22.8* 10.6*  HGB 12.0* 11.0* 10.5*  HCT 40.8 35.0* 33.1*  PLT 429* 361 336    Coag's No results for input(s): APTT, INR in the last 168 hours.  Sepsis Markers Recent Labs  Lab 05/18/17 0526 05/18/17 0534  05/18/17 0733 05/18/17 0940  LATICACIDVEN  --  3.69* 1.9 1.5  PROCALCITON <0.10  --   --   --     ABG Recent Labs  Lab 05/18/17 1410 05/18/17 2251 05/19/17 0340  PHART 7.264* 7.392 7.452*  PCO2ART 54.6* 39.6 35.3  PO2ART 122.0* 115.0* 105    Liver Enzymes Recent Labs  Lab 05/18/17 0337 05/19/17 0515  AST 47* 50*  ALT 24 32  ALKPHOS 58 33*  BILITOT 0.5 0.7  ALBUMIN 3.2* 3.1*    Cardiac Enzymes No results for input(s): TROPONINI, PROBNP in the last 168 hours.  Glucose Recent Labs  Lab 05/18/17 0615 05/18/17 0816 05/18/17 1155 05/19/17 0026 05/19/17 0340 05/19/17 0729  GLUCAP 148* 91 84 90 85 77    Imaging Dg Chest Port 1 View  Result Date: 05/19/2017 CLINICAL DATA:  Hypoxia EXAM: PORTABLE CHEST 1 VIEW COMPARISON:  May 18, 2017 FINDINGS: Endotracheal tube tip is 3.3 cm above the carina. Nasogastric tube tip and side port are below the diaphragm. No pneumothorax. There is atelectatic change in the right base. The lungs elsewhere are clear. Heart is borderline enlarged with pulmonary vascularity within normal limits. No adenopathy. No bone lesions. IMPRESSION: Tube positions as described without pneumothorax. There is right base atelectasis. Lungs elsewhere clear. Stable cardiac silhouette. Electronically Signed   By: Bretta BangWilliam  Woodruff III M.D.   On: 05/19/2017 07:33      CULTURES: No culture results have been reported yet Gram stain of the sputum shows mixed flora  ANTIBIOTICS: Zosyn  DISCUSSION:    This is a 37 year old who was brought to the emergency department after a night of drinking and drug abuse.  EMS apparently was called when he became combative.  He was given Versed and became unresponsive and required intubation and mechanical ventilation.  There was a suspicion of aspiration with a very wide AA gradient.  Toxicology screen on presentation was positive for benzodiazepines cocaine and THC.  ASSESSMENT / PLAN:  PULMONARY A: Oxygen  requirements have come down substantially overnight, mechanics look good, we are attempting a spontaneous breathing trial this morning.  We will continue to treat empirically with Zosyn for possible aspiration, chest x-ray this morning shows only a suggestion of some atelectasis at the right base.    CARDIOVASCULAR A:  No acute issues, he was initially hypotensive and therefore an echocardiogram was obtained which showed normal left ventricular systolic function.   Penny PiaWJ Gray, MD Pulmonary and Critical Care Medicine [facility] Pager: [phone]  05/19/2017, 8:57 AM

## 2017-05-19 NOTE — Care Management Note (Signed)
Case Management Note  Patient Details  Name: Michael Bauer MRN: [id number] Date of Birth: [birth year]  Subjective/Objective:   Pt admitted on 05/18/17 after arriving to the ED unresponsive and hypoxemic.  According to his girlfriend, pt had been drinking and using drugs the night of admission, became combative, resulting in the girlfriend calling police.  Pt was tazed twice by police.  He was intubated in ED for airway protection.  PTA, pt independent of ADLS.                 Action/Plan: Pt currently remains intubated; will continue to follow for discharge planning as pt progresses.  Expected Discharge Date:                  Expected Discharge Plan:     In-House Referral:  Clinical Social Work  Discharge planning Services  CM Consult  Post Acute Care Choice:    Choice offered to:     DME Arranged:    DME Agency:     HH Arranged:    HH Agency:     Status of Service:  In process, will continue to follow  If discussed at Long Length of Stay Meetings, dates discussed:    Additional Comments:   Quintella BatonJulie W. Sevyn Paredez, RN, BSN  Trauma/Neuro ICU Case Manager [phone]

## 2017-05-19 NOTE — Progress Notes (Signed)
I was called to patient's room as patient wants to leave AMA. He was extubated this AM and is currently on 4L O2. He is on day 2/7 of Zosyn for his aspiration pneumonia. He has AKI although do not know what his baseline renal function is. Discussed with patient that we could change his antibiotics to PO. He is agreeable to this as he doesn't like being hooked up to the IV pole. Will change Zosyn to Levaquin now. He also wants to go outside now to smoke. I told him it is nighttime and raining and that no one is going to take him outside in the cold rain. Also he cannot smoke when hooked up to O2. Offered a nicotine patch but he refused. Offered that IF the RN has time, and IF she can find a wheelchair and O2 tank, she could take him downstairs to get some fresh air but NOT to smoke. Discussed with patient that if he left AMA at this time, we would not be able to set up home O2. He needs a 6-minute walk tomorrow AM to determine O2 needs with exertion. Then we need to set up for him to have home O2 through a home health company. Offered IS and Flutter which may help him wean off O2 faster, but he refused. Offered meds to help him sleep tonight but he refused. Asked him to please wait until at least the AM before leaving so that we can set up O2. He also needs follow-up with a PCP for his AKI. He reluctantly agreed to wait.

## 2017-05-19 NOTE — Progress Notes (Signed)
Pt extubated to 4L [place] per MD order. Pt tol well. Positive cuff leak present prior to extubation. VS stable, no stridor is distress noted at this time. Will cont to monitor

## 2017-05-19 NOTE — Progress Notes (Signed)
Pharmacy Antibiotic Note  Michael Bauer is a 37 y.o. male admitted on 05/18/2017 with aspiration pneumonia.  Pharmacy has been consulted for Levaquin PO dosing.  Plan: Levaquin 750mg  PO daily Plan for 7 days total of tx (last day 2/27)  Height: 5\' 10"  (177.8 cm) Weight: 261 lb 11 oz (118.7 kg) IBW/kg (Calculated) : 73  Temp (24hrs), Avg:98.8 F (37.1 C), Min:98.2 F (36.8 C), Max:99.5 F (37.5 C)  Recent Labs  Lab 05/18/17 0337 05/18/17 0348 05/18/17 0526 05/18/17 0534 05/18/17 0554 05/18/17 0733 05/18/17 0940 05/19/17 0515  WBC 28.6*  --   --   --   --  22.8*  --  10.6*  CREATININE 2.18*  --  2.10*  --  2.04*  --   --  2.23*  LATICACIDVEN  --  15.88*  --  3.69*  --  1.9 1.5  --     Estimated Creatinine Clearance: 59.7 mL/min (A) (by C-G formula based on SCr of 2.23 mg/dL (H)).    No Known Allergies  Antimicrobials this admission: Zosyn 2/21 >> 2/22 Levaquin 2/22 >> (2/27)  Dose adjustments this admission: n.a  Microbiology results: 2/21 BCx: ngtd 2/21 Sputum: reincubated    Thank you for allowing pharmacy to be a part of this patient's care.  Michael Bauer, PharmD, BCPS Clinical Pharmacist [phone] 05/19/2017 9:08 PM

## 2017-05-19 NOTE — Progress Notes (Signed)
Patient  was trying to get up and walk to the bathroom without assists. Patient educated that he cannot walk to the bathroom by himself as he is in the ICU and unsteady. Patient very upset and wanted to leave AMA. MD paged to notify.

## 2017-05-19 NOTE — Progress Notes (Signed)
200mL (2000mcg) wasted into sink with Gari CrownAshley Elliott RN.

## 2017-05-19 NOTE — Progress Notes (Addendum)
Wasted 9 mL of fentanyl from tubing in sink with Taylar P. RN.

## 2017-05-19 NOTE — Social Work (Signed)
CSW stopped by pt room to complete check in and assesment. Pt girlfriend was also present in room. Pt declined to speak with social worker, repeating "I don't know who you are" several times. CSW explained role and that if pt or pt girlfriend needed resources as pt improves and is medically ready for discharge, to ask the nurse to notify the social worker. Pt declined again to complete assessment with CSW and then started to fall asleep. Pt girlfriend acknowledged CSW role and stated she would ask RN if they needed anything from CSW.   CSW signing off. Please consult if any additional needs arise.  Doy HutchingIsabel H Ersel Enslin, LCSWA [facility] Clinical Social Work [phone]

## 2017-05-20 LAB — PHOSPHORUS: Phosphorus: 3.9 mg/dL (ref 2.5–4.6)

## 2017-05-20 LAB — GLUCOSE, CAPILLARY
GLUCOSE-CAPILLARY: 99 mg/dL (ref 65–99)
GLUCOSE-CAPILLARY: 122 mg/dL — AB (ref 65–99)

## 2017-05-20 LAB — BLOOD GAS, ARTERIAL
Acid-Base Excess: 2.5 mmol/L — ABNORMAL HIGH (ref 0.0–2.0)
BICARBONATE: 26.7 mmol/L (ref 20.0–28.0)
Drawn by: 51155
FIO2: 28
O2 Saturation: 91.1 %
PH ART: 7.412 (ref 7.350–7.450)
Patient temperature: 98.6
pCO2 arterial: 42.7 mmHg (ref 32.0–48.0)
pO2, Arterial: 61.4 mmHg — ABNORMAL LOW (ref 83.0–108.0)

## 2017-05-20 LAB — MAGNESIUM: MAGNESIUM: 2.1 mg/dL (ref 1.7–2.4)

## 2017-05-20 MED ORDER — LEVOFLOXACIN 750 MG PO TABS: 750.0000 mg | ORAL_TABLET | ORAL | 0 refills | Status: DC

## 2017-05-20 NOTE — Progress Notes (Signed)
Patient DC'd home via car with family.  DC instructions and prescription given.  Both fully understood.  Vital signs and assessments were stable.

## 2017-05-20 NOTE — Discharge Summary (Signed)
Date of admission 05/18/2017 Date of discharge 05/20/2017  Principal diagnoses, Polysubstance abuse Aspiration pneumonia  Hospital course: This is a 37 year old who on the night of 2/20 was drinking and using street drugs.  He returned home combative and active strangely and the police were called.  The patient was tased and subsequently given Versed by EMS.  He was hypoxic and unresponsive on arrival in the emergency room and required intubation and mechanical ventilation.  He had a very wide AA gradient on presentation and was empirically treated with Levaquin for aspiration.  He was extubated on 2/22 without difficulty.   On the morning of discharge he is alert and appropriately and calmly interactive.  He denies a sliding lung disease or dyspnea.  He does report some productive cough.  Vital signs at discharge showed a pulse of 82 blood pressure 127/82 and a temperature of 98.3 degrees. Is calmly and appropriately interactive.  Was no JVD respirations were unlabored, there was good air movement throughout and a few scattered rhonchi.  S1 and S2 are regular without murmur rub or gallop.  The abdomen is obese soft and nontender without any overt organomegaly or masses.  Discharged on Levaquin alone 750 mg p.o. daily for another 3 days.  He has been instructed to return to the emergency room for progressive dyspnea.

## 2017-05-20 NOTE — Progress Notes (Signed)
Patient provided with Good Rx coupon for abx to bring cost down to $13

## 2017-05-21 ENCOUNTER — Emergency Department (HOSPITAL_COMMUNITY): Payer: Self-pay

## 2017-05-21 ENCOUNTER — Observation Stay (HOSPITAL_COMMUNITY): Payer: Self-pay

## 2017-05-21 ENCOUNTER — Encounter (HOSPITAL_COMMUNITY): Payer: Self-pay

## 2017-05-21 ENCOUNTER — Observation Stay (HOSPITAL_COMMUNITY)
Admission: EM | Admit: 2017-05-21 | Discharge: 2017-05-22 | Disposition: A | Discharge status: Home / Self Care | Payer: Self-pay | Attending: Internal Medicine | Admitting: Internal Medicine

## 2017-05-21 DIAGNOSIS — I509 Heart failure, unspecified: Secondary | ICD-10-CM

## 2017-05-21 DIAGNOSIS — E669 Obesity, unspecified: Secondary | ICD-10-CM

## 2017-05-21 DIAGNOSIS — F1290 Cannabis use, unspecified, uncomplicated: Secondary | ICD-10-CM

## 2017-05-21 DIAGNOSIS — F17200 Nicotine dependence, unspecified, uncomplicated: Secondary | ICD-10-CM | POA: Insufficient documentation

## 2017-05-21 DIAGNOSIS — J690 Pneumonitis due to inhalation of food and vomit: Principal | ICD-10-CM | POA: Insufficient documentation

## 2017-05-21 DIAGNOSIS — R0602 Shortness of breath: Secondary | ICD-10-CM

## 2017-05-21 DIAGNOSIS — F17210 Nicotine dependence, cigarettes, uncomplicated: Secondary | ICD-10-CM

## 2017-05-21 DIAGNOSIS — J9601 Acute respiratory failure with hypoxia: Secondary | ICD-10-CM | POA: Insufficient documentation

## 2017-05-21 DIAGNOSIS — R0902 Hypoxemia: Secondary | ICD-10-CM

## 2017-05-21 DIAGNOSIS — J9600 Acute respiratory failure, unspecified whether with hypoxia or hypercapnia: Secondary | ICD-10-CM

## 2017-05-21 DIAGNOSIS — R0600 Dyspnea, unspecified: Secondary | ICD-10-CM

## 2017-05-21 DIAGNOSIS — F1410 Cocaine abuse, uncomplicated: Secondary | ICD-10-CM

## 2017-05-21 DIAGNOSIS — J810 Acute pulmonary edema: Secondary | ICD-10-CM

## 2017-05-21 DIAGNOSIS — E876 Hypokalemia: Secondary | ICD-10-CM

## 2017-05-21 LAB — CBC WITH DIFFERENTIAL/PLATELET
BASOS ABS: 0 10*3/uL (ref 0.0–0.1)
BASOS PCT: 0 %
EOS PCT: 0 %
Eosinophils Absolute: 0.1 10*3/uL (ref 0.0–0.7)
HEMATOCRIT: 36.7 % — AB (ref 39.0–52.0)
Hemoglobin: 11.8 g/dL — ABNORMAL LOW (ref 13.0–17.0)
Lymphocytes Relative: 10 %
Lymphs Abs: 1.4 10*3/uL (ref 0.7–4.0)
MCH: 27.3 pg (ref 26.0–34.0)
MCHC: 32.2 g/dL (ref 30.0–36.0)
MCV: 84.8 fL (ref 78.0–100.0)
MONO ABS: 1.1 10*3/uL — AB (ref 0.1–1.0)
MONOS PCT: 8 %
Neutro Abs: 11.9 10*3/uL — ABNORMAL HIGH (ref 1.7–7.7)
Neutrophils Relative %: 82 %
PLATELETS: 364 10*3/uL (ref 150–400)
RBC: 4.33 MIL/uL (ref 4.22–5.81)
RDW: 13.6 % (ref 11.5–15.5)
WBC: 14.5 10*3/uL — ABNORMAL HIGH (ref 4.0–10.5)

## 2017-05-21 LAB — TROPONIN I
TROPONIN I: 0.15 ng/mL — AB (ref <0.03)
TROPONIN I: 0.15 ng/mL — AB (ref <0.03)

## 2017-05-21 LAB — COMPREHENSIVE METABOLIC PANEL
ALBUMIN: 3.2 g/dL — AB (ref 3.5–5.0)
ALK PHOS: 39 U/L (ref 38–126)
ALT: 35 U/L (ref 17–63)
AST: 59 U/L — AB (ref 15–41)
Anion gap: 12 (ref 5–15)
BILIRUBIN TOTAL: 0.7 mg/dL (ref 0.3–1.2)
BUN: 7 mg/dL (ref 6–20)
CALCIUM: 8.9 mg/dL (ref 8.9–10.3)
CO2: 23 mmol/L (ref 22–32)
CREATININE: 1.5 mg/dL — AB (ref 0.61–1.24)
Chloride: 102 mmol/L (ref 101–111)
GFR calc Af Amer: >60 mL/min (ref >60)
GFR calc non Af Amer: 59 mL/min — ABNORMAL LOW (ref >60)
GLUCOSE: 170 mg/dL — AB (ref 65–99)
Potassium: 3.2 mmol/L — ABNORMAL LOW (ref 3.5–5.1)
Sodium: 137 mmol/L (ref 135–145)
Total Protein: 6.8 g/dL (ref 6.5–8.1)

## 2017-05-21 LAB — URINALYSIS, ROUTINE W REFLEX MICROSCOPIC
BACTERIA UA: NONE SEEN
BILIRUBIN URINE: NEGATIVE
Glucose, UA: NEGATIVE mg/dL
Ketones, ur: NEGATIVE mg/dL
LEUKOCYTES UA: NEGATIVE
NITRITE: NEGATIVE
PH: 7 (ref 5.0–8.0)
Protein, ur: NEGATIVE mg/dL
SPECIFIC GRAVITY, URINE: 1.004 — AB (ref 1.005–1.030)
SQUAMOUS EPITHELIAL / LPF: NONE SEEN

## 2017-05-21 LAB — RAPID URINE DRUG SCREEN, HOSP PERFORMED
Amphetamines: NOT DETECTED
Barbiturates: NOT DETECTED
Benzodiazepines: NOT DETECTED
Cocaine: NOT DETECTED
OPIATES: NOT DETECTED
Tetrahydrocannabinol: POSITIVE — AB

## 2017-05-21 LAB — I-STAT CG4 LACTIC ACID, ED: Lactic Acid, Venous: 2.14 mmol/L (ref 0.5–1.9)

## 2017-05-21 LAB — BRAIN NATRIURETIC PEPTIDE: B NATRIURETIC PEPTIDE 5: 383.4 pg/mL — AB (ref 0.0–100.0)

## 2017-05-21 LAB — I-STAT TROPONIN, ED: Troponin i, poc: 0.14 ng/mL (ref 0.00–0.08)

## 2017-05-21 LAB — CULTURE, RESPIRATORY W GRAM STAIN: Culture: NORMAL

## 2017-05-21 LAB — CULTURE, RESPIRATORY

## 2017-05-21 MED ORDER — FUROSEMIDE 10 MG/ML IJ SOLN
40.0000 mg | Freq: Once | INTRAMUSCULAR | Status: AC
  Administered 2017-05-21: 40 mg via INTRAVENOUS
  Filled 2017-05-21: qty 4

## 2017-05-21 MED ORDER — FUROSEMIDE 10 MG/ML IJ SOLN
40.0000 mg | Freq: Every day | INTRAMUSCULAR | Status: DC
  Administered 2017-05-22: 40 mg via INTRAVENOUS
  Filled 2017-05-21: qty 4

## 2017-05-21 MED ORDER — ACETAMINOPHEN 650 MG RE SUPP: 650.0000 mg | Freq: Four times a day (QID) | RECTAL | Status: DC | PRN

## 2017-05-21 MED ORDER — ENOXAPARIN SODIUM 40 MG/0.4ML ~~LOC~~ SOLN
40.0000 mg | SUBCUTANEOUS | Status: DC
  Administered 2017-05-21: 40 mg via SUBCUTANEOUS
  Filled 2017-05-21: qty 0.4

## 2017-05-21 MED ORDER — FUROSEMIDE 10 MG/ML IJ SOLN: 40.0000 mg | Freq: Every day | INTRAMUSCULAR | Status: DC

## 2017-05-21 MED ORDER — POLYETHYLENE GLYCOL 3350 17 G PO PACK: 17.0000 g | PACK | Freq: Every day | ORAL | Status: DC | PRN

## 2017-05-21 MED ORDER — LEVOFLOXACIN 750 MG PO TABS
750.0000 mg | ORAL_TABLET | ORAL | Status: DC
  Administered 2017-05-21: 750 mg via ORAL
  Filled 2017-05-21: qty 1

## 2017-05-21 MED ORDER — ACETAMINOPHEN 325 MG PO TABS: 650.0000 mg | ORAL_TABLET | Freq: Four times a day (QID) | ORAL | Status: DC | PRN

## 2017-05-21 MED ORDER — POTASSIUM CHLORIDE 10 MEQ/100ML IV SOLN: 10.0000 meq | INTRAVENOUS | Status: DC

## 2017-05-21 MED ORDER — POTASSIUM CHLORIDE CRYS ER 20 MEQ PO TBCR
40.0000 meq | EXTENDED_RELEASE_TABLET | Freq: Two times a day (BID) | ORAL | Status: DC
  Administered 2017-05-21 – 2017-05-22 (×3): 40 meq via ORAL
  Filled 2017-05-21 (×3): qty 2

## 2017-05-21 MED ORDER — LISINOPRIL 5 MG PO TABS
5.0000 mg | ORAL_TABLET | Freq: Every day | ORAL | Status: DC
  Administered 2017-05-21 – 2017-05-22 (×2): 5 mg via ORAL
  Filled 2017-05-21 (×2): qty 1

## 2017-05-21 NOTE — ED Provider Notes (Signed)
Pt signed out by PA Muthersbaugh pending call back from admitting service.  Patient continues to be stable on bipap.  He is resting comfortably with no distress.  He was d/w IM resident who will see pt for admission.   Jacalyn LefevreHaviland, Ronna Herskowitz, MD 05/21/17 [phone]

## 2017-05-21 NOTE — ED Triage Notes (Signed)
Pt comes via [facility], from home, discharge and extubated yesterday from flu and aspiration pneumonia. Woke up tonight SOB, rales in all fields, 78% on RA, on CPAP

## 2017-05-21 NOTE — H&P (Signed)
Date: 05/21/2017               Patient Name:  Michael RinksRayshard D Haak MRN: [id number]  DOB: [birth year] Age / Sex: 37 y.o., male   [facility]         Medical Service: Internal Medicine Teaching Service         Attending Physician: Dr. Doneen PoissonKlima, Lawrence, MD    First Contact: Dr. Alinda MoneyMelvin Pager: [phone]  Second Contact: Dr. Nelson ChimesAmin Pager: [phone]       After Hours (After 5p/  First Contact Pager: [phone]  weekends / holidays): Second Contact Pager: [phone]   Chief Complaint: Dyspnea  History of Present Illness: Mr. Michael Bauer is a 37 yo M without significant past medical history who was recently admitted to the ICU from 2/21 to 2/23 for hypoxic respiratory failure from suspected substance use and aspiration who presents with dyspnea. He was intubated during his recent ICU hospitalization and had been extubated and discharged on 2/24 with instruction to return to the ED if he experienced further worsening dyspnea. He states that he felt normal when discharged. He was discharged on Levaquin to complete a 5 day course. Overnight he awoke from sleep gasping for air at which time his wife called 911. On EMS arrival patient was hypoxic to 78% with diffuse rales. He was placed on CPAP en route and BiPAP in the ED. He states that he experienced an associated 4/10 chest tightness that had improved by the time of interview. He has a history of heavy snoring. He denies fevers, nausea, diarrhea, constipation, changes in urination. HE was taken off BiPAP and put on nasal cannula towards the end of our interview.  In the ED, mildly tachypnic (20s) and bradycardic (50s), Placed on BiPAP and saturating well, BP 130s-150s/70s-90s. CBC showed WBC 14; BMP showed K 3.2; BNP 383; POC Troponin 0.14; POC Lactate 2.14; UA and U-tox pending. EKG showed NSR @ 70 BPM. CXR showed pulmonary vascular congestion, patchy alveolar airspace opacities consistent with pulmonary edema vs atypical infection.  He received 40mg  IV Lasix. Patient to be admitted for further workup and care.  Meds:  Current Meds  Medication Sig  . acetaminophen (TYLENOL) 500 MG tablet Take 1,000 mg by mouth every 6 (six) hours as needed for mild pain or headache.  . ibuprofen (ADVIL,MOTRIN) 200 MG tablet Take 400 mg by mouth every 6 (six) hours as needed for mild pain.  Marland Kitchen. levofloxacin (LEVAQUIN) 750 MG tablet Take 1 tablet (750 mg total) by mouth daily.     Allergies: Allergies as of 05/21/2017  . (No Known Allergies)   History reviewed. No pertinent past medical history.  Family History:  Family History  Problem Relation Age of Onset  . Hypertension Mother   . Diabetes Mother   - Confirmed on Admission  Social History:  Social History   Tobacco Use  . Smoking status: Current Every Day Smoker    Packs/day: 1.00    Years: 20.00    Pack years: 20.00  . Smokeless tobacco: Never Used  Substance Use Topics  . Alcohol use: Yes    Comment: Occasionally   . Drug use: Yes    Types: Marijuana, Cocaine  - Confirmed on Admission  Review of Systems: A complete ROS was negative except as per HPI.  Physical Exam: Blood pressure 132/83, pulse (!) 49, temperature 98.6 F (37 C), temperature source Oral, resp. rate (!) 21, SpO2 94 %. Physical Exam  Constitutional:  He is oriented to person, place, and time. He appears well-developed and well-nourished.  HENT:  Head: Normocephalic and atraumatic.  Eyes: EOM are normal. Right eye exhibits no discharge. Left eye exhibits no discharge.  Cardiovascular: Regular rhythm, normal heart sounds and intact distal pulses.  Pulmonary/Chest: No respiratory distress. He has no wheezes. He has no rales.  On BiPAP Rhonchi L>R  Abdominal: Soft. Bowel sounds are normal. He exhibits no distension. There is no tenderness.  Musculoskeletal: He exhibits no edema or deformity.  Neurological: He is alert and oriented to person, place, and time.  Skin: Skin is warm and dry.    EKG: personally reviewed my interpretation is  NSR @ 70 BPM.   CXR: personally reviewed my interpretation is pulmonary vascular congestion, patchy alveolar airspace opacities consistent with pulmonary edema vs atypical infection  Assessment & Plan by Problem: 37 yo M without significant past medical history who was recently admitted to the ICU from 2/21 to 2/23 for hypoxic respiratory failure from suspected substance use and aspiration who presents with dyspnea.   Acute Hypoxic Respiratory failure: Due to pulmonary Edema 2/2 Cardiogenic (Acute Heart Failure) vs Non-cardiogenic cause. Patient presented with hypoxia following recent admission to ICU for respiratory failure believed to be 2/2 to substance use +/- aspiration, discharged 2/23 following extubation in normal state of health. Patient presented with diffuse rales; BNP 383 (in obese patient); LA 2.14; POC Troponin 0.14. CXR showing increased vascular congestion and opacity (in patient on Levaquin for potential aspiration) suspicious for volume overload in this patient with rales on exam. Received 40mg  IV lasix in ED. Taken off BiPAP during interview, will monitor. - Suspicious for Acute HF, though Echo on 2/21 showed normal systolic and diastolic function - Cardiac monitoring - Continue Levaquin course, last dose 2/25 - Repeat Echocardiogram - Trend Troponin q6h x 3 - Lasix 40mg  IV BID - Repeat Lactate - BiPAP PRN - CBC and BMP in AM  Hypokalemia: Patient noted to have K 3.2 in ED and was given initial dose of IV lasix following this. Will replete. - IV potassium IV x1 - KCl PO BID x2 dose - BMP in AM  FEN:  Heart diet VTE ppx: Lovenox Code Status: Full   Dispo: Admit patient to Observation with expected length of stay less than 2 midnights.  Signed: Beola Cord, MD 05/21/2017, 11:36 AM  Pager: [phone]

## 2017-05-21 NOTE — ED Provider Notes (Signed)
[facility] EMERGENCY DEPARTMENT Provider Note   CSN: [id number] Arrival date & time: 05/21/17  4098     History   Chief Complaint Chief Complaint  Patient presents with  . Respiratory Distress    HPI Michael Bauer is a 37 y.o. male with no significant medical history until approximately 2 days ago presents to the Emergency Department complaining of gradual, persistent, progressively worsening shortness of breath onset just prior to arrival after waking at home.  Patient was discharged from the hospital this morning.  Per record review, patient was combative and altered upon arrival on 05/18/2017 after drinking and doing street drugs.  He vomited and aspirated in route to the hospital.  Due to the fact that he was obtunded and hypoxic to 39% he was intubated.  Patient was extubated on 222 without difficulty and treated for aspiration pneumonia.  Patient reports that he felt normal upon discharge yesterday morning had no complaints yesterday.  He was discharged home with Levaquin alone after being given Levaquin during his hospital stay.  He denies a cardiac history or history of CHF.  Per EMS, upon arrival to his residence today patient was hypoxic to 78% and had rales throughout.  He was placed on CPAP with some improvement.  Patient's wife at bedside reports that patient awoke from sleep gasping for air prompting the call to EMS.  Patient denies chest pain, leg swelling, fevers at home.     The history is provided by the patient, medical records and the spouse. No language interpreter was used.    History reviewed. No pertinent past medical history.  Patient Active Problem List   Diagnosis Date Noted  . Acute encephalopathy 05/18/2017    History reviewed. No pertinent surgical history.     Home Medications    Prior to Admission medications   Medication Sig Start Date End Date Taking? Authorizing Provider  acetaminophen (TYLENOL) 500 MG tablet Take 1,000 mg  by mouth every 6 (six) hours as needed for mild pain or headache.   Yes [provider]  ibuprofen (ADVIL,MOTRIN) 200 MG tablet Take 400 mg by mouth every 6 (six) hours as needed for mild pain.   Yes [provider]  levofloxacin (LEVAQUIN) 750 MG tablet Take 1 tablet (750 mg total) by mouth daily. 05/20/17  Yes Jeanella Craze, NP    Family History No family history on file.  Social History Social History   Tobacco Use  . Smoking status: Current Every Day Smoker  . Smokeless tobacco: Never Used  Substance Use Topics  . Alcohol use: Yes    Comment: Occasionally   . Drug use: Yes    Types: Marijuana     Allergies   Patient has no known allergies.   Review of Systems Review of Systems  Constitutional: Negative for appetite change, diaphoresis, fatigue, fever and unexpected weight change.  HENT: Negative for mouth sores.   Eyes: Negative for visual disturbance.  Respiratory: Positive for chest tightness and shortness of breath. Negative for cough and wheezing.   Cardiovascular: Negative for chest pain.  Gastrointestinal: Negative for abdominal pain, constipation, diarrhea, nausea and vomiting.  Endocrine: Negative for polydipsia, polyphagia and polyuria.  Genitourinary: Negative for dysuria, frequency, hematuria and urgency.  Musculoskeletal: Negative for back pain and neck stiffness.  Skin: Negative for rash.  Allergic/Immunologic: Negative for immunocompromised state.  Neurological: Negative for syncope, light-headedness and headaches.  Hematological: Does not bruise/bleed easily.  Psychiatric/Behavioral: Negative for sleep disturbance. The patient is  not nervous/anxious.   All other systems reviewed and are negative.    Physical Exam Updated Vital Signs BP 136/78   Pulse (!) 59   Temp 98.6 F (37 C) (Oral)   Resp (!) 25   SpO2 100%   Physical Exam  Constitutional: He appears well-developed and well-nourished. No distress.  Awake, alert,  nontoxic appearance  HENT:  Head: Normocephalic and atraumatic.  Mouth/Throat: Oropharynx is clear and moist. No oropharyngeal exudate.  Eyes: Conjunctivae are normal. No scleral icterus.  Neck: Normal range of motion. Neck supple.  Cardiovascular: Regular rhythm and intact distal pulses. Bradycardia present.  Pulses:      Radial pulses are 2+ on the right side, and 2+ on the left side.  Pulmonary/Chest: Accessory muscle usage present. No stridor. Tachypnea noted. He is in respiratory distress. He has no wheezes. He has rales (throughout).  Equal chest expansion  Abdominal: Soft. Bowel sounds are normal. He exhibits no mass. There is no tenderness. There is no rebound and no guarding.  Musculoskeletal: Normal range of motion. He exhibits no edema.  No peripheral edema  Neurological: He is alert.  Speech is clear and goal oriented Moves extremities without ataxia  Skin: Skin is warm. He is diaphoretic.  Psychiatric: He has a normal mood and affect.  Nursing note and vitals reviewed.    ED Treatments / Results  Labs (all labs ordered are listed, but only abnormal results are displayed) Labs Reviewed  COMPREHENSIVE METABOLIC PANEL - Abnormal; Notable for the following components:      Result Value   Potassium 3.2 (*)    Glucose, Bld 170 (*)    Creatinine, Ser 1.50 (*)    Albumin 3.2 (*)    AST 59 (*)    GFR calc non Af Amer 59 (*)    All other components within normal limits  CBC WITH DIFFERENTIAL/PLATELET - Abnormal; Notable for the following components:   WBC 14.5 (*)    Hemoglobin 11.8 (*)    HCT 36.7 (*)    Neutro Abs 11.9 (*)    Monocytes Absolute 1.1 (*)    All other components within normal limits  BRAIN NATRIURETIC PEPTIDE - Abnormal; Notable for the following components:   B Natriuretic Peptide 383.4 (*)    All other components within normal limits  I-STAT CG4 LACTIC ACID, ED - Abnormal; Notable for the following components:   Lactic Acid, Venous 2.14 (*)    All  other components within normal limits  I-STAT TROPONIN, ED - Abnormal; Notable for the following components:   Troponin i, poc 0.14 (*)    All other components within normal limits  URINALYSIS, ROUTINE W REFLEX MICROSCOPIC  I-STAT CG4 LACTIC ACID, ED    EKG  EKG Interpretation  Date/Time:  Sunday May 21 2017 05:36:32 EST Ventricular Rate:  70 PR Interval:    QRS Duration: 86 QT Interval:  413 QTC Calculation: 446 R Axis:   44 Text Interpretation:  Sinus rhythm Normal ECG Confirmed by Geoffery LyonseLo, Douglas ([id number]) on 05/21/2017 5:41:06 AM       Radiology Dg Chest Portable 1 View  Result Date: 05/21/2017 CLINICAL DATA:  Respiratory distress tonight. EXAM: PORTABLE CHEST 1 VIEW COMPARISON:  Chest radiograph May 19, 2017 FINDINGS: Interval extubation removal of nasogastric tube. Stable cardiomegaly. Pulmonary vascular congestion with patchy alveolar airspace opacities. No pleural effusion though LEFT costophrenic angle incompletely imaged. No pneumothorax. Soft tissue planes and included osseous structures are normal. IMPRESSION: Increasing pulmonary vascular congestion. Patchy alveolar airspace  opacities seen with pulmonary edema or atypical infection. Stable cardiomegaly. Interval extubation and removal of nasogastric tube. Electronically Signed   By: Awilda Metro M.D.   On: 05/21/2017 06:01    Procedures .Critical Care Performed by: Dierdre Forth, PA-C Authorized by: Dierdre Forth, PA-C   Critical care provider statement:    Critical care time (minutes):  35   Critical care time was exclusive of:  Separately billable procedures and treating other patients   Critical care was necessary to treat or prevent imminent or life-threatening deterioration of the following conditions:  Respiratory failure   Critical care was time spent personally by me on the following activities:  Development of treatment plan with patient or surrogate, evaluation of patient's response  to treatment, examination of patient, obtaining history from patient or surrogate, ordering and performing treatments and interventions, ordering and review of laboratory studies, ordering and review of radiographic studies, pulse oximetry, re-evaluation of patient's condition and review of old charts   I assumed direction of critical care for this patient from another provider in my specialty: no     (including critical care time)  Medications Ordered in ED Medications  furosemide (LASIX) injection 40 mg (not administered)     Initial Impression / Assessment and Plan / ED Course  I have reviewed the triage vital signs and the nursing notes.  Pertinent labs & imaging results that were available during my care of the patient were reviewed by me and considered in my medical decision making (see chart for details).  Clinical Course as of May 21 729  Sun May 21, 2017  0714 afebrile Temp: 98.6 F (37 C) [HM]    Clinical Course User Index [HM] Sahan Pen, Dahlia Client, [place]    Patient with diaphoresis, respiratory distress, accessory muscle usage and rales throughout.  Patient arrives on CPAP.  Transition to BiPAP here in the emergency department.  Concern for persistent or worsening pneumonia versus new onset congestive heart failure and fluid overload.  No peripheral edema.  Patient's oxygen saturations are adequate while on BiPAP.  Elevated lactic acid 2.14.  Additional labs and x-rays pending.  7:16 AM With increasing white blood cell count at 14.5.  He is bradycardic in the mid 50s.  Elevated troponin at 0.14.  No cardiac history.  Patient also with elevated BNP at 383.4.  He is afebrile.  Tolerating BiPAP without difficulty.  Likely new onset CHF.  Will diuresis,  Will need admission for further evaluation and treatment.   Final Clinical Impressions(s) / ED Diagnoses   Final diagnoses:  Acute pulmonary edema [facility])  Shortness of breath  Hypoxia    ED Discharge Orders    None        Milta Deiters 05/21/17 1191    Geoffery Lyons, MD 05/21/17 2255

## 2017-05-21 NOTE — ED Notes (Signed)
Pt off bipap , placed on 4 l,iters n/c

## 2017-05-21 NOTE — Progress Notes (Signed)
Patient stated that he will attempt to wear the CPAP/BIPAP tonight.  RT applied the CPAP and bled in 2 L O2.  Patient is tolerating at this time.  RT made RN aware.     05/21/17 2317  BiPAP/CPAP/SIPAP  BiPAP/CPAP/SIPAP Pt Type Adult  Mask Type Full face mask  Mask Size Extra large  Respiratory Rate 20 breaths/min  IPAP 12 cmH20  EPAP 6 cmH2O  Oxygen Percent 28 %  Flow Rate 2 lpm  BiPAP/CPAP/SIPAP BiPAP  Patient Home Equipment No  Auto Titrate No  BiPAP/CPAP /SiPAP Vitals  Bilateral Breath Sounds Diminished

## 2017-05-22 ENCOUNTER — Other Ambulatory Visit: Payer: Self-pay

## 2017-05-22 ENCOUNTER — Observation Stay (HOSPITAL_BASED_OUTPATIENT_CLINIC_OR_DEPARTMENT_OTHER): Payer: Self-pay

## 2017-05-22 DIAGNOSIS — F1990 Other psychoactive substance use, unspecified, uncomplicated: Secondary | ICD-10-CM

## 2017-05-22 DIAGNOSIS — J9601 Acute respiratory failure with hypoxia: Secondary | ICD-10-CM

## 2017-05-22 DIAGNOSIS — I110 Hypertensive heart disease with heart failure: Secondary | ICD-10-CM

## 2017-05-22 DIAGNOSIS — J9600 Acute respiratory failure, unspecified whether with hypoxia or hypercapnia: Secondary | ICD-10-CM

## 2017-05-22 DIAGNOSIS — J690 Pneumonitis due to inhalation of food and vomit: Secondary | ICD-10-CM

## 2017-05-22 LAB — BASIC METABOLIC PANEL
ANION GAP: 11 (ref 5–15)
BUN: 8 mg/dL (ref 6–20)
CHLORIDE: 102 mmol/L (ref 101–111)
CO2: 24 mmol/L (ref 22–32)
Calcium: 9 mg/dL (ref 8.9–10.3)
Creatinine, Ser: 1.24 mg/dL (ref 0.61–1.24)
GFR calc Af Amer: >60 mL/min (ref >60)
GFR calc non Af Amer: >60 mL/min (ref >60)
Glucose, Bld: 100 mg/dL — ABNORMAL HIGH (ref 65–99)
POTASSIUM: 3.4 mmol/L — AB (ref 3.5–5.1)
SODIUM: 137 mmol/L (ref 135–145)

## 2017-05-22 LAB — ECHOCARDIOGRAM LIMITED
HEIGHTINCHES: 69 in
Weight: 4056.46 oz

## 2017-05-22 LAB — CBC
HEMATOCRIT: 36.9 % — AB (ref 39.0–52.0)
HEMOGLOBIN: 12.1 g/dL — AB (ref 13.0–17.0)
MCH: 27.4 pg (ref 26.0–34.0)
MCHC: 32.8 g/dL (ref 30.0–36.0)
MCV: 83.5 fL (ref 78.0–100.0)
Platelets: 355 10*3/uL (ref 150–400)
RBC: 4.42 MIL/uL (ref 4.22–5.81)
RDW: 13.3 % (ref 11.5–15.5)
WBC: 17 10*3/uL — AB (ref 4.0–10.5)

## 2017-05-22 LAB — GLUCOSE, CAPILLARY: Glucose-Capillary: 103 mg/dL — ABNORMAL HIGH (ref 65–99)

## 2017-05-22 LAB — TROPONIN I
Troponin I: 0.14 ng/mL (ref <0.03)
Troponin I: 0.08 ng/mL (ref <0.03)

## 2017-05-22 MED ORDER — SODIUM CHLORIDE 0.9 % IV SOLN
3.0000 g | Freq: Four times a day (QID) | INTRAVENOUS | Status: DC
  Administered 2017-05-22: 3 g via INTRAVENOUS
  Filled 2017-05-22 (×2): qty 3

## 2017-05-22 MED ORDER — PERFLUTREN LIPID MICROSPHERE
1.0000 mL | INTRAVENOUS | Status: AC | PRN
  Administered 2017-05-22: 2 mL via INTRAVENOUS
  Filled 2017-05-22: qty 10

## 2017-05-22 MED ORDER — AMOXICILLIN-POT CLAVULANATE 875-125 MG PO TABS: 1.0000 | ORAL_TABLET | Freq: Two times a day (BID) | ORAL | 0 refills | Status: AC

## 2017-05-22 MED ORDER — LISINOPRIL 5 MG PO TABS: 5.0000 mg | ORAL_TABLET | Freq: Every day | ORAL | 0 refills | Status: DC

## 2017-05-22 MED ORDER — AZITHROMYCIN 250 MG PO TABS
500.0000 mg | ORAL_TABLET | Freq: Every day | ORAL | Status: AC
  Administered 2017-05-22: 500 mg via ORAL
  Filled 2017-05-22: qty 2

## 2017-05-22 MED ORDER — AZITHROMYCIN 250 MG PO TABS: 250.0000 mg | ORAL_TABLET | Freq: Every day | ORAL | Status: DC

## 2017-05-22 NOTE — Progress Notes (Signed)
Pt. Unable to tolerate CPAP/BIPAP overnight. RN applied nasal cannula back. Pt currently in 2L O2 [place]. Will monitor.

## 2017-05-22 NOTE — Discharge Instructions (Signed)
Acute Respiratory Failure, Adult °Acute respiratory failure occurs when there is not enough oxygen passing from your lungs to your body. When this happens, your lungs have trouble removing carbon dioxide from the blood. This causes your blood oxygen level to drop too low as carbon dioxide builds up. °Acute respiratory failure is a medical emergency. It can develop quickly, but it is temporary if treated promptly. Your lung capacity, or how much air your lungs can hold, may improve with time, exercise, and treatment. °What are the causes? °There are many possible causes of acute respiratory failure, including: °· Lung injury. °· Chest injury or damage to the ribs or tissues near the lungs. °· Lung conditions that affect the flow of air and blood into and out of the lungs, such as pneumonia, acute respiratory distress syndrome, and cystic fibrosis. °· Medical conditions, such as strokes or spinal cord injuries, that affect the muscles and nerves that control breathing. °· Blood infection (sepsis). °· Inflammation of the pancreas (pancreatitis). °· A blood clot in the lungs (pulmonary embolism). °· A large-volume blood transfusion. °· Burns. °· Near-drowning. °· Seizure. °· Smoke inhalation. °· Reaction to medicines. °· Alcohol or drug overdose. ° °What increases the risk? °This condition is more likely to develop in people who have: °· A blocked airway. °· Asthma. °· A condition or disease that damages or weakens the muscles, nerves, bones, or tissues that are involved in breathing. °· A serious infection. °· A health problem that blocks the unconscious reflex that is involved in breathing, such as hypothyroidism or sleep apnea. °· A lung injury or trauma. ° °What are the signs or symptoms? °Trouble breathing is the main symptom of acute respiratory failure. Symptoms may also include: °· Rapid breathing. °· Restlessness or anxiety. °· Skin, lips, or fingernails that appear blue (cyanosis). °· Rapid heart  rate. °· Abnormal heart rhythms (arrhythmias). °· Confusion or changes in behavior. °· Tiredness or loss of energy. °· Feeling sleepy or having a loss of consciousness. ° °How is this diagnosed? °Your health care provider can diagnose acute respiratory failure with a medical history and physical exam. During the exam, your health care provider will listen to your heart and check for crackling or wheezing sounds in your lungs. Your may also have tests to confirm the diagnosis and determine what is causing respiratory failure. These tests may include: °· Measuring the amount of oxygen in your blood (pulse oximetry). The measurement comes from a small device that is placed on your finger, earlobe, or toe. °· Other blood tests to measure blood gases and to look for signs of infection. °· Sampling your cerebral spinal fluid or tracheal fluid to check for infections. °· Chest X-ray to look for fluid in spaces that should be filled with air. °· Electrocardiogram (ECG) to look at the heart's electrical activity. ° °How is this treated? °Treatment for this condition usually takes places in a hospital intensive care unit (ICU). Treatment depends on what is causing the condition. It may include one or more treatments until your symptoms improve. Treatment may include: °· Supplemental oxygen. Extra oxygen is given through a tube in the nose, a face mask, or a hood. °· A device such as a continuous positive airway pressure (CPAP) or bi-level positive airway pressure (BiPAP or BPAP) machine. This treatment uses mild air pressure to keep the airways open. A mask or other device will be placed over your nose or mouth. A tube that is connected to a motor will deliver   oxygen through the mask.  Ventilator. This treatment helps move air into and out of the lungs. This may be done with a bag and mask or a machine. For this treatment, a tube is placed in your windpipe (trachea) so air and oxygen can flow to the lungs.  Extracorporeal  membrane oxygenation (ECMO). This treatment temporarily takes over the function of the heart and lungs, supplying oxygen and removing carbon dioxide. ECMO gives the lungs a chance to recover. It may be used if a ventilator is not effective.  Tracheostomy. This is a procedure that creates a hole in the neck to insert a breathing tube.  Receiving fluids and medicines.  Rocking the bed to help breathing.  Follow these instructions at home:  Take over-the-counter and prescription medicines only as told by your health care provider.  Return to normal activities as told by your health care provider. Ask your health care provider what activities are safe for you.  Keep all follow-up visits as told by your health care provider. This is important. How is this prevented? Treating infections and medical conditions that may lead to acute respiratory failure can help prevent the condition from developing. Contact a health care provider if:  You have a fever.  Your symptoms do not improve or they get worse. Get help right away if:  You are having trouble breathing.  You lose consciousness.  Your have cyanosis or turn blue.  You develop a rapid heart rate.  You are confused. These symptoms may represent a serious problem that is an emergency. Do not wait to see if the symptoms will go away. Get medical help right away. Call your local emergency services (911 in the [place] Do not drive yourself to the hospital. This information is not intended to replace advice given to you by your health care provider. Make sure you discuss any questions you have with your health care provider. Document Released: 03/19/2013 Document Revised: 10/10/2015 Document Reviewed: 09/30/2015 Elsevier Interactive Patient Education  2018 [employer]   Shortness of Breath, Adult Shortness of breath is when a person has trouble breathing enough air, or when a person feels like she or he is having trouble breathing in  enough air. Shortness of breath could be a sign of medical problem. Follow these instructions at home: Pay attention to any changes in your symptoms. Take these actions to help with your condition:  Do not smoke. Smoking is a common cause of shortness of breath. If you smoke and you need help quitting, ask your health care provider.  Avoid things that can irritate your airways, such as: ? Mold. ? Dust. ? Air pollution. ? Chemical fumes. ? Things that can cause allergy symptoms (allergens), if you have allergies.  Keep your living space clean and free of mold and dust.  Rest as needed. Slowly return to your usual activities.  Take over-the-counter and prescription medicines, including oxygen and inhaled medicines, only as told by your health care provider.  Keep all follow-up visits as told by your health care provider. This is important.  Contact a health care provider if:  Your condition does not improve as soon as expected.  You have a hard time doing your normal activities, even after you rest.  You have new symptoms. Get help right away if:  Your shortness of breath gets worse.  You have shortness of breath when you are resting.  You feel light-headed or you faint.  You have a cough that is not controlled with  medicines.  You cough up blood.  You have pain with breathing.  You have pain in your chest, arms, shoulders, or abdomen.  You have a fever.  You cannot walk up stairs or exercise the way that you normally do. This information is not intended to replace advice given to you by your health care provider. Make sure you discuss any questions you have with your health care provider. Document Released: 12/07/2000 Document Revised: 10/03/2015 Document Reviewed: 08/20/2015 Elsevier Interactive Patient Education  [employer].

## 2017-05-22 NOTE — Progress Notes (Signed)
   Subjective:  [name] was seen resting in his bed this morning. He states he is feeling much better and denies shortness of breath nor any recurrence of his chest tightness. He was oxygenating well with supplemental oxygen turned off so this was removed. He has no further questions or complaints this morning.  Objective:  Vital signs in last 24 hours: Vitals:   05/22/17 0022 05/22/17 0211 05/22/17 0452 05/22/17 0500  BP: (!) 127/59  (!) 148/96   Pulse: (!) 54 (!) 48 (!) 47   Resp: (!) 28 19 14    Temp: 98 F (36.7 C)  98.2 F (36.8 C)   TempSrc: Oral  Oral   SpO2: 97% 98% 95%   Weight:    253 lb 8.5 oz (115 kg)  Height:       Physical Exam  Constitutional: He is oriented to person, place, and time. He appears well-developed and well-nourished. No distress.  HENT:  Head: Normocephalic and atraumatic.  Eyes: EOM are normal. Right eye exhibits no discharge. Left eye exhibits no discharge.  Cardiovascular: Normal rate, regular rhythm, normal heart sounds and intact distal pulses.  Pulmonary/Chest: Effort normal. No respiratory distress.  Bronchial Breath sounds on the left posteriorly  Abdominal: Soft. Bowel sounds are normal. He exhibits no distension.  Musculoskeletal: He exhibits no edema or deformity.  Neurological: He is alert and oriented to person, place, and time.  Skin: Skin is warm and dry.   Assessment/Plan: 37 yo M without significant past medical history who was recently admitted to the ICU from 2/21 to 2/23 for hypoxic respiratory failure from suspected substance use and aspiration who presents with dyspnea. Presented with diffuse rales, BNP 383 (in obese patient), POC Troponin 0.14, and CXR showing increased vascular congestion and opacity (in patient on Levaquin for potential aspiration). This was suspicious for volume overload 2/2 to acute heart failure and patient received 40mg  IV lasix in ED with resolution of rales. Repeat 2 view CXR showed clear LLL and RLL  pneumonia (new form previous admission) suspicious for aspiration pneumonia not responding to previous therapy.  Aspiration Pneumonia  Acute Hypoxic Respiratory failure: Clinically much better today. Oxygenating well on room air. Initial presentation suspicious for acute heart failure; 2 view CXR illustrates bilateral pneumonia with distribution consistent with aspiraiton. Will obtain echocardiogram to evaluate if patient symptoms are due to combination of pneumonia and heart failure or pneumonia alone. Will consider repeat CBC if not discharged today. - Echo on 2/21 showed normal systolic and diastolic function - Troponin Trend flat 0.14-0.15, Will repeat troponin - Cardiac monitoring - Repeat Echocardiogram - Discountinue Levaquin - Azithromycin 500mg  PO Today, then 250mg  PO Daily - Unasyn 3g IV q6h - Lasix 40mg  IV BID, Net -2.5L - Started Lisinpril 5mg  Daily  Hypokalemia: Improved to 3.4 today. Will give additional day of supplementation. - KCl 40mEq PO BID x2 dose  FEN:  Heart diet VTE ppx: Lovenox Code Status: Full   Dispo: Anticipated discharge in approximately 0-1 day(s).   Beola CordMelvin, Michael Cerino, MD 05/22/2017, 11:17 AM Pager: [phone]

## 2017-05-22 NOTE — Progress Notes (Signed)
Pharmacy Antibiotic Note  Peterson Alen BlewD XXXGreen is a 37 y.o. male admitted on 05/21/2017 with shortness of breath.  Pharmacy has been consulted for Unasyn dosing for aspiration pneumonia coverage.  Recent admission (2/20-2/23), intubated on 2/21 early am in ED then extubated 2/22.   Levaquin 750 mg PO begun 2/22, last dose 2/24 at 4:30pm, now changed to Azithromycin PO.  BiPAP last night, now on 2L O2.  Tmax 99.2, WBC 17.0.  Plan:  Unasyn 3gm IV q6hrs.  Follow renal function, culture data, progress and antibiotic plans.    Height: 5\' 9"  (175.3 cm) Weight: 253 lb 8.5 oz (115 kg) IBW/kg (Calculated) : 70.7  Temp (24hrs), Avg:98.4 F (36.9 C), Min:98 F (36.7 C), Max:99.3 F (37.4 C)  Recent Labs  Lab 05/18/17 0337 05/18/17 0348 05/18/17 0526 05/18/17 0534 05/18/17 0554 05/18/17 0733 05/18/17 0940 05/19/17 0515 05/21/17 0535 05/21/17 0547 05/22/17 0155  WBC 28.6*  --   --   --   --  22.8*  --  10.6* 14.5*  --  17.0*  CREATININE 2.18*  --  2.10*  --  2.04*  --   --  2.23* 1.50*  --  1.24  LATICACIDVEN  --  15.88*  --  3.69*  --  1.9 1.5  --   --  2.14*  --     Estimated Creatinine Clearance: 104 mL/min (by C-G formula based on SCr of 1.24 mg/dL).    No Known Allergies  Antimicrobials this admission:  Levaquin 2/22 (prior admit)>> 2/25  Unasyn 2/25>>  Azithro PO 2/25>>(3/1)    - Zosyn 2/22>>2/23 (prior admit)  Dose adjustments this admission:  Microbiology results:  cultures from prior admit:   2/21 tracheal aspirate - normal flora   2/21 blood x 2 - no growith x 3 days to date  Thank you for allowing pharmacy to be a part of this patient's care.  Dennie Fettersgan, Thiago Ragsdale Donovan, [place] Pager: [phone] 05/22/2017 10:38 AM

## 2017-05-22 NOTE — Progress Notes (Signed)
  Echocardiogram 2D Echocardiogram has been performed.  Michael SavoyCasey N Leyan Bauer 05/22/2017, 11:37 AM

## 2017-05-22 NOTE — Discharge Summary (Signed)
Name: Michael Bauer MRN: [id number] DOB: [birth year] 37 y.o. [facility]  Date of Admission: 05/21/2017  5:29 AM Date of Discharge: 05/22/2017 Attending Physician: Rocco SereneKLIMA, LAWRENCE D, MD  Discharge Diagnosis:  1. Aspiration Pneumonia, Acute Hypoxic Respiratory failure  Discharge Medications: Allergies as of 05/22/2017   No Known Allergies     Medication List    STOP taking these medications   levofloxacin 750 MG tablet Commonly known as:  LEVAQUIN     TAKE these medications   acetaminophen 500 MG tablet Commonly known as:  TYLENOL Take 1,000 mg by mouth every 6 (six) hours as needed for mild pain or headache.   amoxicillin-clavulanate 875-125 MG tablet Commonly known as:  AUGMENTIN Take 1 tablet by mouth 2 (two) times daily for 5 days.   ibuprofen 200 MG tablet Commonly known as:  ADVIL,MOTRIN Take 400 mg by mouth every 6 (six) hours as needed for mild pain.   lisinopril 5 MG tablet Commonly known as:  PRINIVIL,ZESTRIL Take 1 tablet (5 mg total) by mouth daily. Start taking on:  05/23/2017       Disposition and follow-up:   [name] [facility].  At the hospital follow up visit please address:  1. Aspiration Pneumonia  Acute Hypoxic Respiratory failure: - Ensure patient was able to obtain and complete antibiotics course - Ensure resolution of symptoms and no recurrence  2. Other: Patient initiated on Lisinopril 5mg  Daily during admission for hypertension and concern for acute heart failure; though Echo remained normal, diastolic function was not fully assessed and patient can benefit from Lisinopril for HTN alone.  3.  Labs / imaging needed at time of follow-up: None  4.  Pending labs/ test needing follow-up: None  Follow-up Appointments: Follow-up Information    [facility] Follow up on 06/12/2017.   Why:  @  2:00 pm for Hospital Follow Up Appointment with Sindy Messingoger Gomez PA- If you can not make this scheduled appointment please call the office in a timely manner to cancel.  Contact information: [place] [place] [phone]       [facility]. Go to.   Why:  This location for medicaiton assistance. Medicaitons will range in cost fro, $4.00-$10.00. Contact information: [address] [place] [phone]          Hospital Course by problem list:  1. Aspiration Pneumonia  Acute Hypoxic Respiratory failure: 37 yo M without significant past medical history who was recently admitted to the ICU from 2/21 to 2/23 for hypoxic respiratory failure believed to be due to polysubstance use and suspicion for aspiration pneumonia presented again on 2/24 with acute dyspnea overnight. On initial presentation, patient was noted to have diffuse rales. His laboratory workup showed BNP 383 (in obese patient), POC Troponin 0.14, and CXR showed increased vascular congestion and opacity (in a patient already on course of Levaquin for potential aspiration). He had received about 4hrs of BiPAP in the ED, but was transitioned to nasal cannula at the time of admission. This initial presentation was concerning for volume overload 2/2 to acute heart failure, despite patients normal echocardiogram days prior. Patient received 40mg  IV lasix in ED with only minimal rales noted on exam by internal medicine team. He was started on lasix and repeat echo was ordered. Patient responded well and was able to saturate comfortably  in the 90s on RA in the morning.   Repeat 2 view CXR very suspicious for aspiration pneumonia (new form previous admission). Patient Given a dose of Azithromycin (prevously on 4d of Levaquin) and was started on Unasyn. New leukocytosis to 17 also favored infectious process. Troponin initially flat at 0.14, but  final troponin trended down to 0.08. Patient underwent repeat echocardiogram that showed no significant change from previous (although technically insufficient to fully evaluate diastolic function). These findings were consistent with aspiration pneumonia not responding to previous therapy as the etioloy of patient's dyspnea. Patient discharged with 5 day course of Augmentin.  Discharge Vitals:   BP (!) 146/84 (BP Location: Right Arm)   Pulse 63   Temp 98.9 F (37.2 C) (Oral)   Resp 17   Ht 5\' 9"  (1.753 m)   Wt 253 lb 8.5 oz (115 kg)   SpO2 94%   BMI 37.44 kg/m   Pertinent Labs, Studies, and Procedures:  CBC Latest Ref Rng & Units 05/22/2017 05/21/2017 05/19/2017  WBC 4.0 - 10.5 K/uL 17.0(H) 14.5(H) 10.6(H)  Hemoglobin 13.0 - 17.0 g/dL 12.1(L) 11.8(L) 10.5(L)  Hematocrit 39.0 - 52.0 % 36.9(L) 36.7(L) 33.1(L)  Platelets 150 - 400 K/uL 355 364 336   BMP Latest Ref Rng & Units 05/22/2017 05/21/2017 05/19/2017  Glucose 65 - 99 mg/dL 409(W) 119(J) 83  BUN 6 - 20 mg/dL 8 7 14   Creatinine 0.61 - 1.24 mg/dL 4.78 2.95(A) 2.13(Y)  Sodium 135 - 145 mmol/L 137 137 145  Potassium 3.5 - 5.1 mmol/L 3.4(L) 3.2(L) 3.8  Chloride 101 - 111 mmol/L 102 102 112(H)  CO2 22 - 32 mmol/L 24 23 23   Calcium 8.9 - 10.3 mg/dL 9.0 8.9 8.6(V)   EKG Interpretation Date/Time:  Sunday May 21 2017 05:36:32 EST Ventricular Rate:  70 PR Interval:    QRS Duration: 86 QT Interval:  413 QTC Calculation: 446 R Axis:   44 Text Interpretation:  Sinus rhythm Normal ECG Confirmed by Geoffery Lyons ([id number]) on 05/21/2017 5:41:06 AM  CXR 2/24 (Initial, 1 View): IMPRESSION: - Increasing pulmonary vascular congestion. Patchy alveolar airspace opacities seen with pulmonary edema or atypical infection. - Stable cardiomegaly. - Interval extubation and removal of nasogastric tube.  CXR 2/24 (Repeat, 2 View): IMPRESSION: Patchy bilateral airspace disease compatible with pneumonia. Overall no significant  change.  Echocardiogram: Study Conclusions - Left ventricle: The cavity size was moderately dilated. Systolic function was normal. The estimated ejection fraction was in the range of 50% to 55%. - The study is not technically sufficient to allow evaluation of LV diastolic function Impressions: - No significant change in LVEF from echo of 05/18/17.  Discharge Instructions: Discharge Instructions    Call MD for:  difficulty breathing, headache or visual disturbances   Complete by:  As directed    Call MD for:  persistant dizziness or light-headedness   Complete by:  As directed    Call MD for:  temperature >100.4   Complete by:  As directed    Diet - low sodium heart healthy   Complete by:  As directed    Discharge instructions   Complete by:  As directed    Thank you for allowing [place] to care for you.  Your symptoms are believed to be due to Pneumonia  For the pneumonia: - Please pick up and take Augmentin 825-125mg , Twice a day, for 5 days  We have also prescribed lisinopril 5mg  Daily, for elevated blood pressure and to provide protection against heart failure  Your Echocardiogram showed normal pump function without significant change from your last echo.  Please schedule a follow up appointment with [facility] @ 2:00pm on 06/12/17  Contact a medical professional if you experience a return of severe symptoms   Increase activity slowly   Complete by:  As directed       Signed: Beola Cord, MD 05/22/2017, 8:58 PM   Pager: [phone]

## 2017-05-22 NOTE — Care Management Note (Addendum)
Case Management Note  Patient Details  Name: Melinda CrutchRayshard D XXXGreen MRN: [id number] Date of Birth: 02/21/1981  Subjective/Objective:  Pt presented for Acute Shortness of Breath. Chest X-ray revealed Bilateral Patchy Infiltrates and placed on the BIPAP. Pt is without Insurance and PCP. Pt staes he can afford medications once d/c.                   Action/Plan: Pt has had 2 admissions back to back and is in need of PCP. CM did call the [facility] to see if any appointments available. CM awaiting phone call back from Clinic to schedule appointment. Information to then be placed on AVS. If appointment can be scheduled  pt can utilize the [facility] for medication assistance meds ranging from $4.00-$10.00. No further needs from CM at this time.   Expected Discharge Date:                  Expected Discharge Plan:  Home/Self Care  In-House Referral:  NA  Discharge planning Services  CM Consult, Follow-up appt scheduled, [facility].   Post Acute Care Choice:  NA Choice offered to:  NA  DME Arranged:  N/A DME Agency:  NA  HH Arranged:  NA HH Agency:  NA  Status of Service:  Completed, signed off  If discussed at Long Length of Stay Meetings, dates discussed:    Additional Comments: 1518 05-22-17 Gerome ApleyBrenda Graves-Bigelow,RN,BSN [phone] [facility] and [facility] was scheduled. No further needs from CM at this time.  Gala LewandowskyGraves-Bigelow, Samiel Peel Kaye, RN 05/22/2017, 2:16 PM

## 2017-05-23 LAB — CULTURE, BLOOD (ROUTINE X 2)
Culture: NO GROWTH
Culture: NO GROWTH
SPECIAL REQUESTS: ADEQUATE
Special Requests: ADEQUATE

## 2017-06-12 ENCOUNTER — Inpatient Hospital Stay (INDEPENDENT_AMBULATORY_CARE_PROVIDER_SITE_OTHER): Payer: Self-pay | Admitting: Physician Assistant

## 2019-08-12 IMAGING — DX DG CHEST 2V
2 series · 2 of 2 positions shown · non-contrast
Comparison: 05/21/2017

CLINICAL DATA: Shortness of Breath

EXAM:
CHEST  2 VIEW

[chest pa]
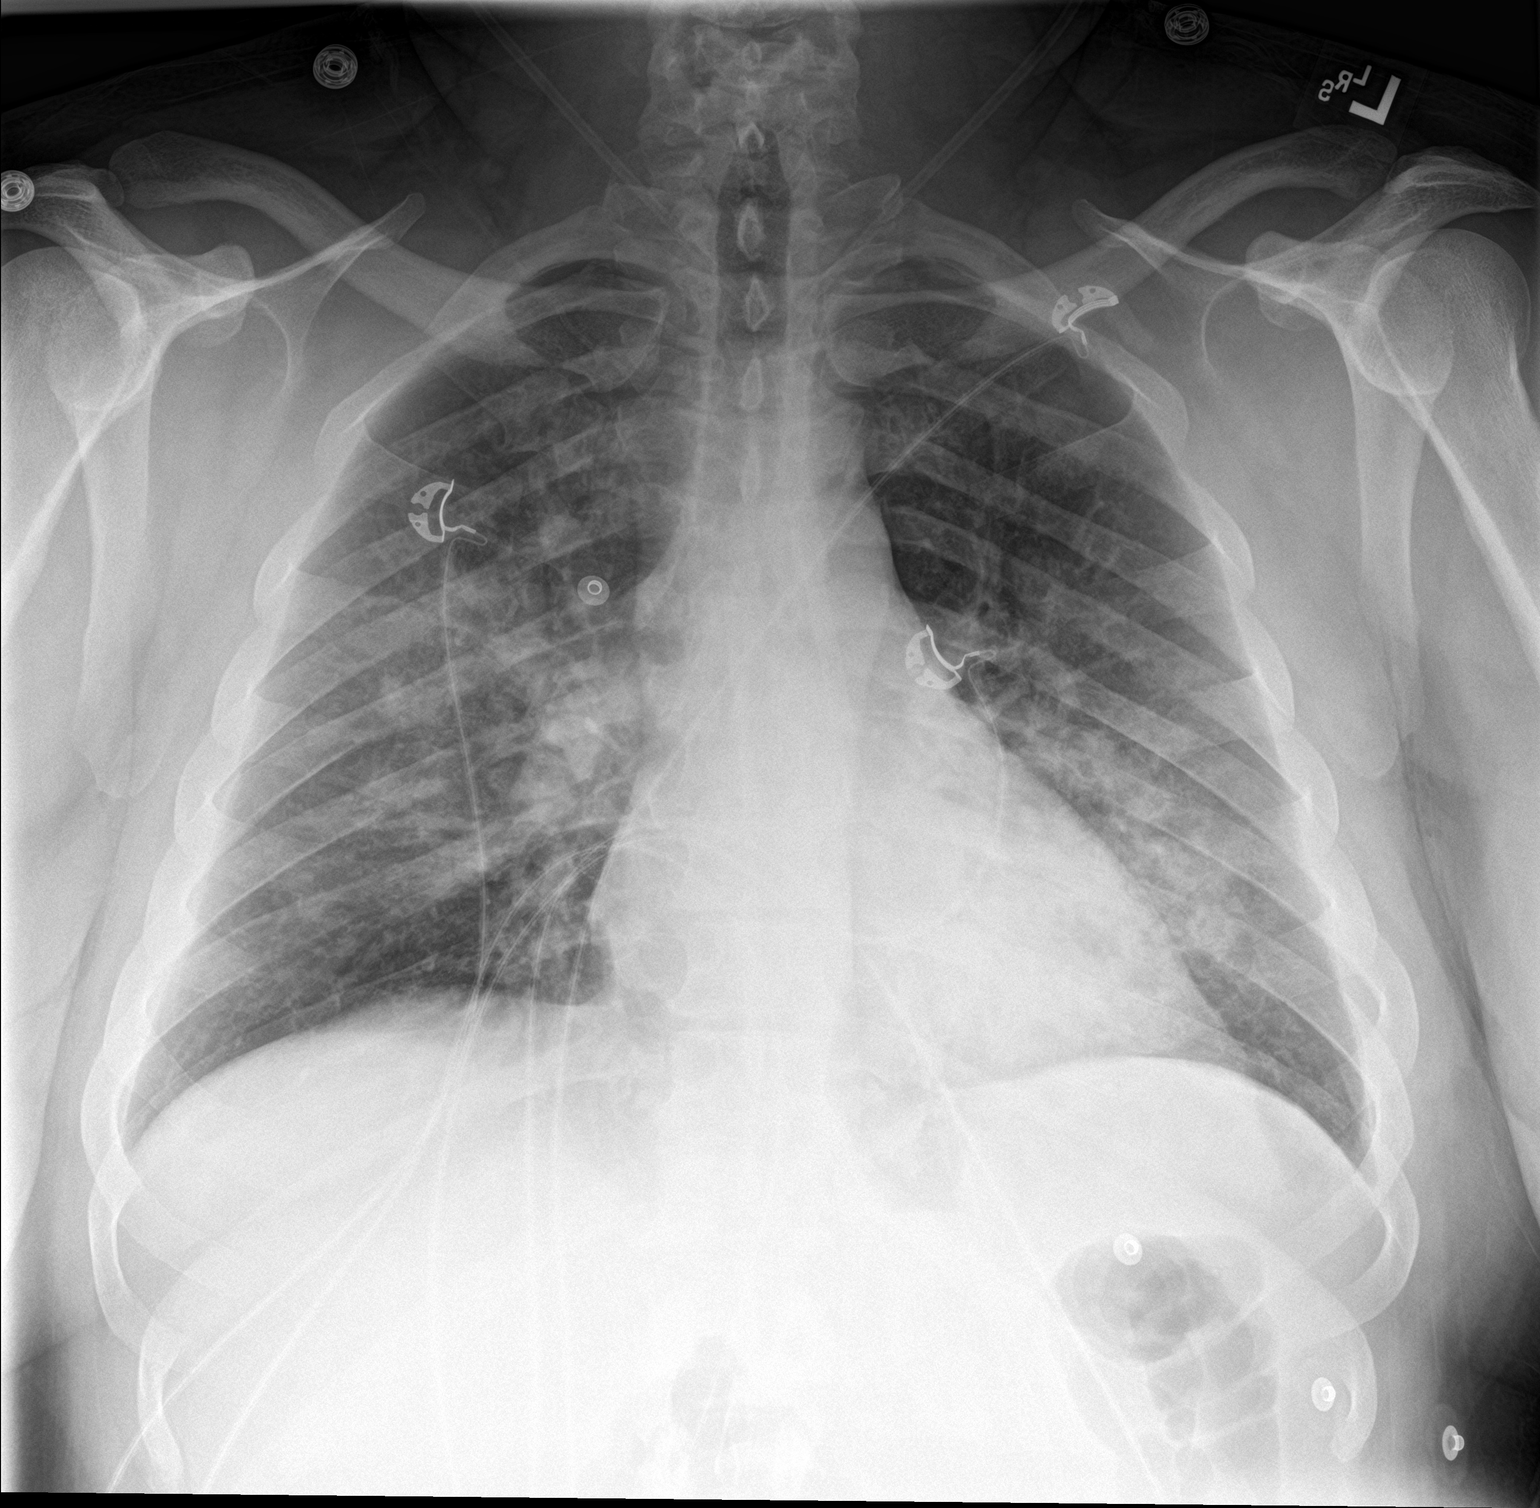

[chest lat]
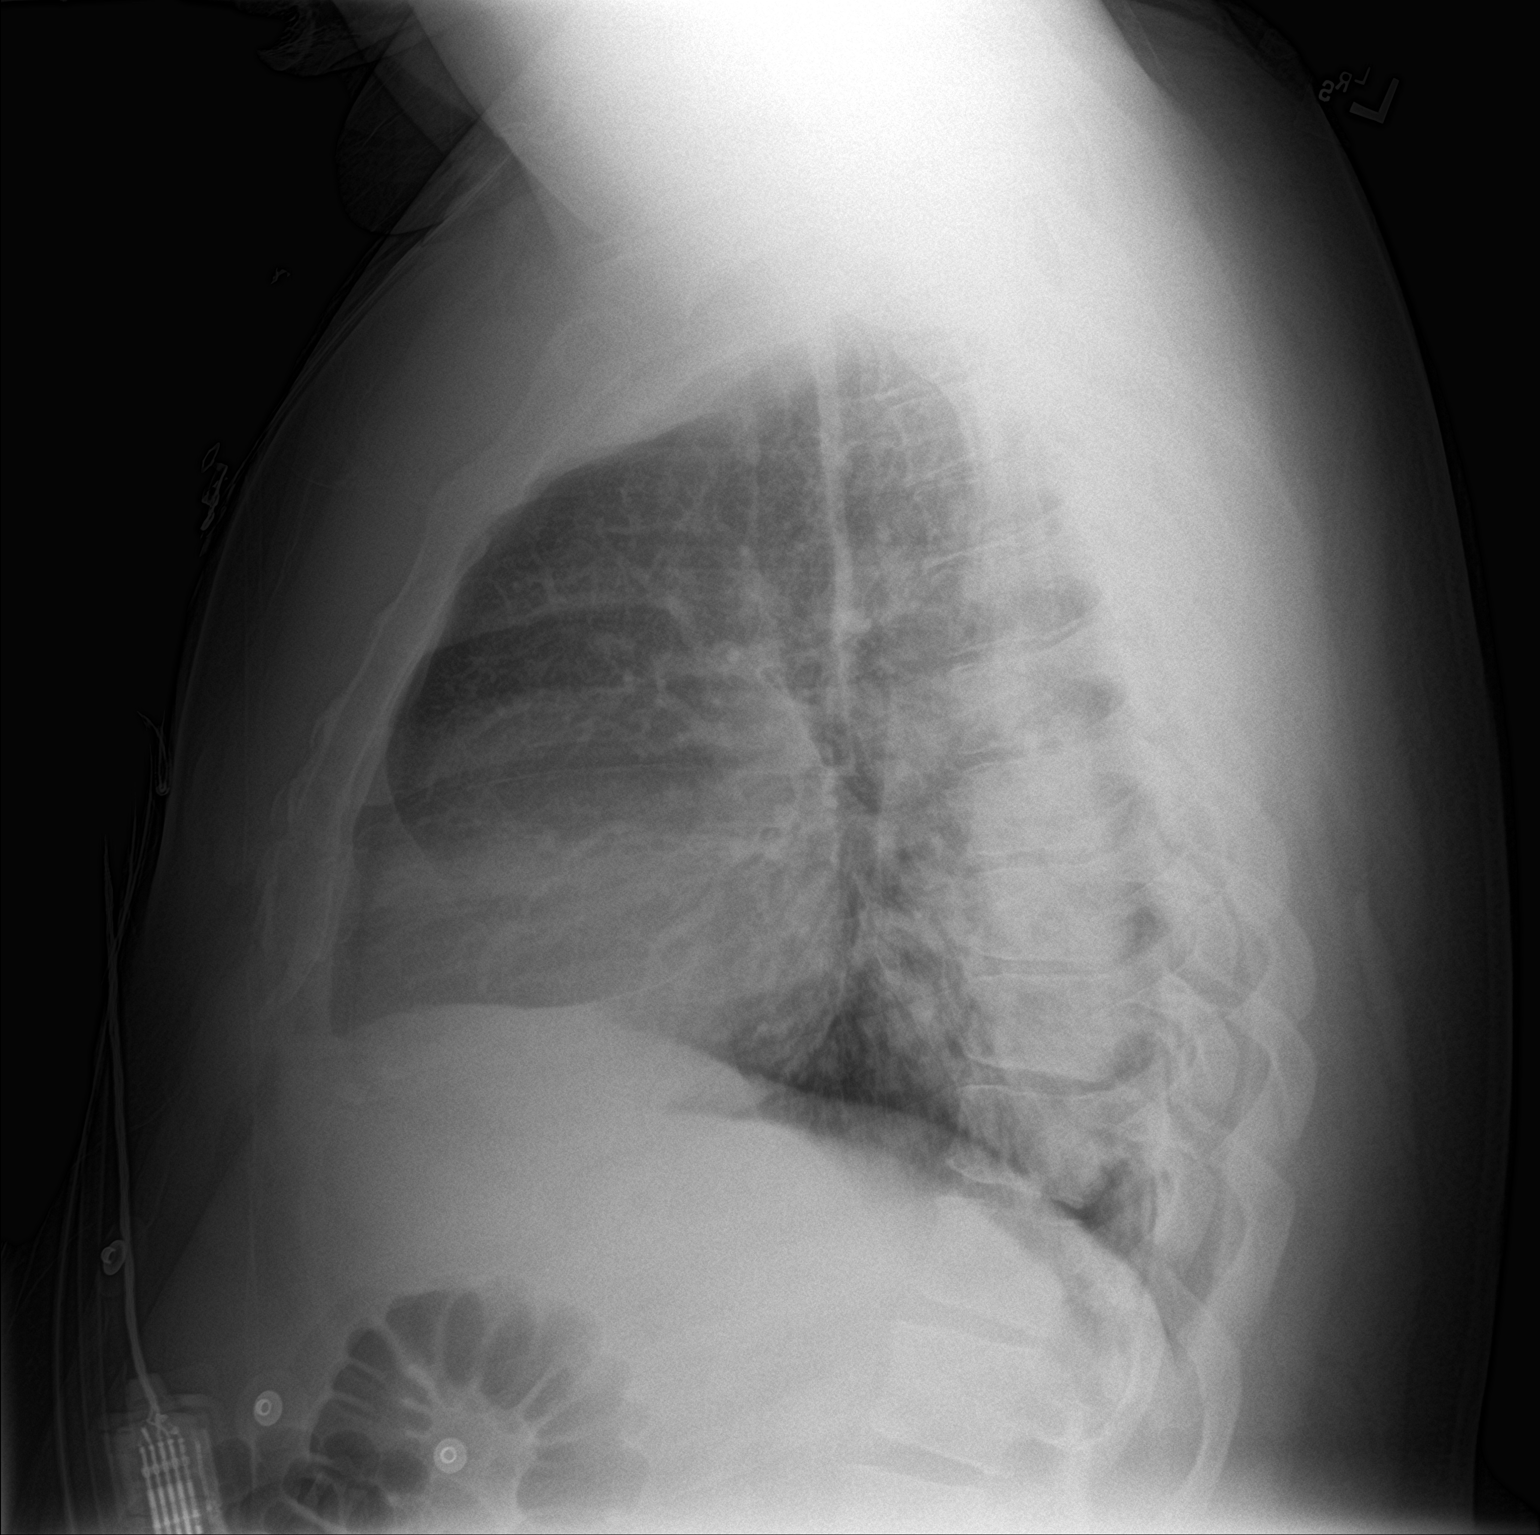

[2 of 2 positions shown; findings below may reference images not displayed]

FINDINGS: Patchy bilateral airspace opacities are again noted, slightly
increased in the right upper lobe, improved at the right base and
unchanged on the left. Heart is normal size. No effusions or acute
bony abnormality.
IMPRESSION: Patchy bilateral airspace disease compatible with pneumonia. Overall
no significant change.

## 2022-02-15 ENCOUNTER — Encounter (HOSPITAL_COMMUNITY): Payer: Self-pay | Admitting: *Deleted

## 2022-02-15 ENCOUNTER — Ambulatory Visit (HOSPITAL_COMMUNITY)
Admission: EM | Admit: 2022-02-15 | Discharge: 2022-02-15 | Disposition: A | Discharge status: Home / Self Care | Payer: Self-pay

## 2022-02-15 DIAGNOSIS — M545 Low back pain, unspecified: Secondary | ICD-10-CM

## 2022-02-15 DIAGNOSIS — M62838 Other muscle spasm: Secondary | ICD-10-CM

## 2022-02-15 HISTORY — DX: Type 2 diabetes mellitus without complications: E11.9

## 2022-02-15 HISTORY — DX: Essential (primary) hypertension: I10

## 2022-02-15 LAB — POCT URINALYSIS DIPSTICK, ED / UC
Bilirubin Urine: NEGATIVE
Glucose, UA: NEGATIVE mg/dL
Ketones, ur: NEGATIVE mg/dL
Leukocytes,Ua: NEGATIVE
Nitrite: NEGATIVE
Protein, ur: NEGATIVE mg/dL
Specific Gravity, Urine: 1.02 (ref 1.005–1.030)
Urobilinogen, UA: 0.2 mg/dL (ref 0.0–1.0)
pH: 6 (ref 5.0–8.0)

## 2022-02-15 MED ORDER — TIZANIDINE HCL 4 MG PO TABS: 4.0000 mg | ORAL_TABLET | Freq: Four times a day (QID) | ORAL | 0 refills | Status: DC | PRN

## 2022-02-15 MED ORDER — IBUPROFEN 600 MG PO TABS: 600.0000 mg | ORAL_TABLET | Freq: Four times a day (QID) | ORAL | 0 refills | Status: DC | PRN

## 2022-02-15 NOTE — ED Triage Notes (Signed)
Denies any known injuries. C/O left low back pain onset approx 1 month ago. Occasionally has radiation of pain around left side. Tried one dose Tyl without relief.

## 2022-02-15 NOTE — Discharge Instructions (Signed)
Advised to use ice therapy, 10 minutes on 20 minutes off, 3-4 times throughout the day for several days to help reduce pain and acute spasm. Advised take ibuprofen 600 mg every 8 hours with food on a regular basis to help reduce pain and discomfort. Advised to take the Zanaflex every 6 hours on a regular basis to help reduce muscle spasm and irritability. Advised follow-up PCP or return to urgent care if symptoms fail to improve.

## 2022-02-15 NOTE — ED Provider Notes (Signed)
[name]    CSN: [id number] Arrival date & time: 02/15/22  0815      History   Chief Complaint Chief Complaint  Patient presents with   Back Pain    HPI Michael Bauer is a 41 y.o. male.   41 year old male presents with lower back pain.  Patient indicates for the past several weeks he has been having recurrent lower back pain which is mainly on the left side.  He relates that the pain is localized but he rates it at an 8 on a scale of 1-10.  Patient indicates that the pain is worse when he sits for prolonged period of time, stands turns and bends over to pick up an object.  Patient indicates that he does not recall any trauma to the back however he relates that he has had the same pain lasting for several months intermittently and it may have started after he was working out and lifting weights.  Patient indicates he did get some relief from the discomfort when he was taken a muscle relaxer.  He indicates he is not taking any other OTC medicines to help relieve the pain.  He did indicate he used some heat which tend to give some mild relief.  He denies any numbness, tingling, or weakness going down the either leg.  He indicates that he is urinating normally without any dysuria and has not noticed any hematuria in the urine.  He relates bowel movements are normal.   Back Pain   Past Medical History:  Diagnosis Date   Diabetes mellitus without complication ([place])    Hypertension     Patient Active Problem List   Diagnosis Date Noted   Aspiration pneumonia ([place]) 05/22/2017   Acute respiratory failure ([place]) 05/22/2017   Acute encephalopathy 05/18/2017    Past Surgical History:  Procedure Laterality Date   NO PAST SURGERIES         Home Medications    Prior to Admission medications   Medication Sig Start Date End Date Taking? Authorizing Provider  acetaminophen (TYLENOL) 500 MG tablet Take 1,000 mg by mouth every 6 (six) hours as needed for mild pain or  headache.   Yes [provider]  AMLODIPINE BESYLATE PO Take by mouth.   Yes [provider]  ibuprofen (ADVIL) 600 MG tablet Take 1 tablet (600 mg total) by mouth every 6 (six) hours as needed. 02/15/22  Yes Nyoka Lint, PA-C  LISINOPRIL-HYDROCHLOROTHIAZIDE PO Take by mouth.   Yes [provider]  RAMIPRIL PO Take by mouth.   Yes [provider]  tiZANidine (ZANAFLEX) 4 MG tablet Take 1 tablet (4 mg total) by mouth every 6 (six) hours as needed for muscle spasms. 02/15/22  Yes Nyoka Lint, PA-C  UNKNOWN TO PATIENT Another HTN med "that starts with a 'P' "   Yes [provider]  UNKNOWN TO PATIENT 36 units of insulin (cannot verify name)   Yes [provider]  lisinopril (PRINIVIL,ZESTRIL) 5 MG tablet Take 1 tablet (5 mg total) by mouth daily. 05/23/17   Marcelyn Bruins, MD    Family History Family History  Problem Relation Age of Onset   Hypertension Mother    Diabetes Mother     Social History Social History   Tobacco Use   Smoking status: Every Day    Packs/day: 1.00    Years: 20.00    Total pack years: 20.00    Types: Cigarettes   Smokeless tobacco: Never  Vaping  Use   Vaping Use: Never used  Substance Use Topics   Alcohol use: Yes    Comment: Occasionally    Drug use: Yes    Types: Marijuana, Cocaine    Comment: No longer using cocaine     Allergies   Patient has no known allergies.   Review of Systems Review of Systems  Musculoskeletal:  Positive for back pain (lower left side).     Physical Exam Triage Vital Signs ED Triage Vitals  Enc Vitals Group     BP 02/15/22 0839 133/89     Pulse Rate 02/15/22 0839 (!) 108     Resp 02/15/22 0839 18     Temp 02/15/22 0839 98.6 F (37 C)     Temp Source 02/15/22 0839 Oral     SpO2 02/15/22 0839 95 %     Weight --      Height --      Head Circumference --      Peak Flow --      Pain Score 02/15/22 0840 8     Pain Loc --      Pain Edu? --      Excl.  in [place]? --    No data found.  Updated Vital Signs BP 133/89   Pulse (!) 108   Temp 98.6 F (37 C) (Oral)   Resp 18   SpO2 95%   Visual Acuity Right Eye Distance:   Left Eye Distance:   Bilateral Distance:    Right Eye Near:   Left Eye Near:    Bilateral Near:     Physical Exam Constitutional:      Appearance: Normal appearance.  Abdominal:     General: Abdomen is flat. Bowel sounds are normal.     Palpations: Abdomen is soft.     Tenderness: There is no abdominal tenderness. There is no right CVA tenderness or left CVA tenderness.  Musculoskeletal:       Back:     Comments: : Pain is palpated along the left lower L1-L3 paraspinous area without any unusual redness or swelling.  Negative straight leg raise bilaterally, strength is intact bilaterally.  Pain is increased with bending and twisting of the back.  Neurological:     Mental Status: He is alert.      UC Treatments / Results  Labs (all labs ordered are listed, but only abnormal results are displayed) Labs Reviewed  POCT URINALYSIS DIPSTICK, ED / UC - Abnormal; Notable for the following components:      Result Value   Hgb urine dipstick SMALL (*)    All other components within normal limits    EKG   Radiology No results found.  Procedures Procedures (including critical care time)  Medications Ordered in UC Medications - No data to display  Initial Impression / Assessment and Plan / UC Course  I have reviewed the triage vital signs and the nursing notes.  Pertinent labs & imaging results that were available during my care of the patient were reviewed by me and considered in my medical decision making (see chart for details).    Plan: 1.  The acute low back pain will be treated with the following: A.  Ibuprofen 600 mg every 8 hours with food on a regular basis to decrease pain. B.  Advised ice therapy, 10 minutes on 20 minutes off, 3-4 times of the evening over the next several days to reduce  spasm and pain. 2.  The muscle spasm in the  lower back will be treated with the following: A.  Zanaflex 4 mg every 6 hours on a regular basis to help reduce muscle spasm and irritability. 3.  Patient advised follow-up PCP or return to urgent care if symptoms fail to improve. Final Clinical Impressions(s) / UC Diagnoses   Final diagnoses:  Acute left-sided low back pain without sciatica  Muscle spasm     Discharge Instructions      Advised to use ice therapy, 10 minutes on 20 minutes off, 3-4 times throughout the day for several days to help reduce pain and acute spasm. Advised take ibuprofen 600 mg every 8 hours with food on a regular basis to help reduce pain and discomfort. Advised to take the Zanaflex every 6 hours on a regular basis to help reduce muscle spasm and irritability. Advised follow-up PCP or return to urgent care if symptoms fail to improve.    ED Prescriptions     Medication Sig Dispense Auth. Provider   ibuprofen (ADVIL) 600 MG tablet Take 1 tablet (600 mg total) by mouth every 6 (six) hours as needed. 30 tablet Ellsworth Lennox, PA-C   tiZANidine (ZANAFLEX) 4 MG tablet Take 1 tablet (4 mg total) by mouth every 6 (six) hours as needed for muscle spasms. 30 tablet Ellsworth Lennox, PA-C      PDMP not reviewed this encounter.   Ellsworth Lennox, PA-C 02/15/22 [phone]

## 2022-03-03 ENCOUNTER — Other Ambulatory Visit: Payer: Self-pay

## 2022-03-03 ENCOUNTER — Emergency Department (HOSPITAL_COMMUNITY)
Admission: EM | Admit: 2022-03-03 | Discharge: 2022-03-03 | Discharge status: Against Medical Advice | Payer: Self-pay | Attending: Medical | Admitting: Medical

## 2022-03-03 DIAGNOSIS — M549 Dorsalgia, unspecified: Secondary | ICD-10-CM | POA: Insufficient documentation

## 2022-03-03 DIAGNOSIS — Z5321 Procedure and treatment not carried out due to patient leaving prior to being seen by health care provider: Secondary | ICD-10-CM | POA: Insufficient documentation

## 2022-03-03 LAB — CBC WITH DIFFERENTIAL/PLATELET
Abs Immature Granulocytes: 0.02 10*3/uL (ref 0.00–0.07)
Basophils Absolute: 0.1 10*3/uL (ref 0.0–0.1)
Basophils Relative: 1 %
Eosinophils Absolute: 0 10*3/uL (ref 0.0–0.5)
Eosinophils Relative: 0 %
HCT: 50.8 % (ref 39.0–52.0)
Hemoglobin: 16.3 g/dL (ref 13.0–17.0)
Immature Granulocytes: 0 %
Lymphocytes Relative: 24 %
Lymphs Abs: 1.7 10*3/uL (ref 0.7–4.0)
MCH: 27.5 pg (ref 26.0–34.0)
MCHC: 32.1 g/dL (ref 30.0–36.0)
MCV: 85.8 fL (ref 80.0–100.0)
Monocytes Absolute: 2.2 10*3/uL — ABNORMAL HIGH (ref 0.1–1.0)
Monocytes Relative: 29 %
Neutro Abs: 3.4 10*3/uL (ref 1.7–7.7)
Neutrophils Relative %: 46 %
Platelets: 324 10*3/uL (ref 150–400)
RBC: 5.92 MIL/uL — ABNORMAL HIGH (ref 4.22–5.81)
RDW: 14 % (ref 11.5–15.5)
WBC: 7.4 10*3/uL (ref 4.0–10.5)
nRBC: 0 % (ref 0.0–0.2)

## 2022-03-03 LAB — COMPREHENSIVE METABOLIC PANEL
ALT: 38 U/L (ref 0–44)
AST: 39 U/L (ref 15–41)
Albumin: 4.7 g/dL (ref 3.5–5.0)
Alkaline Phosphatase: 41 U/L (ref 38–126)
Anion gap: 10 (ref 5–15)
BUN: 16 mg/dL (ref 6–20)
CO2: 23 mmol/L (ref 22–32)
Calcium: 9.4 mg/dL (ref 8.9–10.3)
Chloride: 99 mmol/L (ref 98–111)
Creatinine, Ser: 1.22 mg/dL (ref 0.61–1.24)
GFR, Estimated: >60 mL/min (ref >60)
Glucose, Bld: 102 mg/dL — ABNORMAL HIGH (ref 70–99)
Potassium: 3.9 mmol/L (ref 3.5–5.1)
Sodium: 132 mmol/L — ABNORMAL LOW (ref 135–145)
Total Bilirubin: 0.7 mg/dL (ref 0.3–1.2)
Total Protein: 9 g/dL — ABNORMAL HIGH (ref 6.5–8.1)

## 2022-03-03 LAB — URINALYSIS, ROUTINE W REFLEX MICROSCOPIC
Bacteria, UA: NONE SEEN
Bilirubin Urine: NEGATIVE
Glucose, UA: NEGATIVE mg/dL
Ketones, ur: NEGATIVE mg/dL
Leukocytes,Ua: NEGATIVE
Nitrite: NEGATIVE
Protein, ur: NEGATIVE mg/dL
Specific Gravity, Urine: 1.015 (ref 1.005–1.030)
pH: 5 (ref 5.0–8.0)

## 2022-03-03 MED ORDER — LIDOCAINE 5 % EX PTCH: 1.0000 | MEDICATED_PATCH | CUTANEOUS | Status: DC

## 2022-03-03 MED ORDER — KETOROLAC TROMETHAMINE 30 MG/ML IJ SOLN: 30.0000 mg | Freq: Once | INTRAMUSCULAR | Status: DC

## 2022-03-03 NOTE — ED Triage Notes (Signed)
Pt reports left flank pain x 10 months Denies urinary s/sy.  Denies known injuries or fall Seen on 11/21 for the same

## 2022-03-03 NOTE — ED Provider Triage Note (Signed)
Emergency Medicine Provider Triage Evaluation Note  Michael Bauer , a 41 y.o. male  was evaluated in triage.  Pt complains of left sided back painx10 months. Has tried muscle relaxers w/o relief. Denies urinary symptoms.  Review of Systems  Positive: Back pain Negative: Fever, chils  Physical Exam  BP (!) 142/76 (BP Location: Left Arm)   Pulse (!) 102   Temp 98.7 F (37.1 C) (Oral)   Resp 18   Ht 5\' 9"  (1.753 m)   Wt 115 kg   SpO2 98%   BMI 37.44 kg/m  Gen:   Awake, no distress   Resp:  Normal effort  MSK:   Moves extremities without difficulty  Other:  +L flank pain  Medical Decision Making  Medically screening exam initiated at 11:33 AM.  Appropriate orders placed.  Michael Bauer was informed that the remainder of the evaluation will be completed by another provider, this initial triage assessment does not replace that evaluation, and the importance of remaining in the ED until their evaluation is complete.     , Pete Pelt 03/03/22 1136

## 2022-06-06 ENCOUNTER — Ambulatory Visit: Payer: Medicaid Other | Admitting: Family Medicine

## 2022-06-20 ENCOUNTER — Ambulatory Visit
Admission: EM | Admit: 2022-06-20 | Discharge: 2022-06-20 | Discharge status: Against Medical Advice | Payer: Medicaid Other

## 2022-08-14 ENCOUNTER — Encounter (HOSPITAL_COMMUNITY): Payer: Self-pay | Admitting: Emergency Medicine

## 2022-08-14 ENCOUNTER — Ambulatory Visit (INDEPENDENT_AMBULATORY_CARE_PROVIDER_SITE_OTHER): Payer: Medicaid Other

## 2022-08-14 ENCOUNTER — Ambulatory Visit (HOSPITAL_COMMUNITY)
Admission: EM | Admit: 2022-08-14 | Discharge: 2022-08-14 | Disposition: A | Discharge status: Home / Self Care | Payer: Medicaid Other | Attending: Emergency Medicine | Admitting: Emergency Medicine

## 2022-08-14 ENCOUNTER — Other Ambulatory Visit: Payer: Self-pay

## 2022-08-14 DIAGNOSIS — M47817 Spondylosis without myelopathy or radiculopathy, lumbosacral region: Secondary | ICD-10-CM

## 2022-08-14 DIAGNOSIS — Z760 Encounter for issue of repeat prescription: Secondary | ICD-10-CM | POA: Diagnosis not present

## 2022-08-14 DIAGNOSIS — M51369 Other intervertebral disc degeneration, lumbar region without mention of lumbar back pain or lower extremity pain: Secondary | ICD-10-CM

## 2022-08-14 DIAGNOSIS — Z113 Encounter for screening for infections with a predominantly sexual mode of transmission: Secondary | ICD-10-CM | POA: Diagnosis not present

## 2022-08-14 DIAGNOSIS — E119 Type 2 diabetes mellitus without complications: Secondary | ICD-10-CM

## 2022-08-14 DIAGNOSIS — Z794 Long term (current) use of insulin: Secondary | ICD-10-CM

## 2022-08-14 DIAGNOSIS — M5136 Other intervertebral disc degeneration, lumbar region: Secondary | ICD-10-CM

## 2022-08-14 LAB — POCT FASTING CBG KUC MANUAL ENTRY: POCT Glucose (KUC): 173 mg/dL — AB (ref 70–99)

## 2022-08-14 MED ORDER — ACCU-CHEK SOFTCLIX LANCETS MISC: 0 refills | Status: AC

## 2022-08-14 MED ORDER — ACCU-CHEK GUIDE VI STRP: ORAL_STRIP | 0 refills | Status: AC

## 2022-08-14 MED ORDER — PEN NEEDLES 32G X 4 MM MISC: 0 refills | Status: DC

## 2022-08-14 MED ORDER — LISINOPRIL 2.5 MG PO TABS: 2.5000 mg | ORAL_TABLET | Freq: Every day | ORAL | 0 refills | Status: DC

## 2022-08-14 MED ORDER — BACLOFEN 10 MG PO TABS: 10.0000 mg | ORAL_TABLET | Freq: Three times a day (TID) | ORAL | 0 refills | Status: AC

## 2022-08-14 MED ORDER — LANTUS SOLOSTAR 100 UNIT/ML ~~LOC~~ SOPN: 18.0000 [IU] | PEN_INJECTOR | Freq: Every day | SUBCUTANEOUS | 0 refills | Status: DC

## 2022-08-14 MED ORDER — ATORVASTATIN CALCIUM 40 MG PO TABS: 40.0000 mg | ORAL_TABLET | Freq: Every day | ORAL | 0 refills | Status: DC

## 2022-08-14 MED ORDER — ACCU-CHEK GUIDE W/DEVICE KIT: PACK | 0 refills | Status: AC

## 2022-08-14 MED ORDER — NAPROXEN 500 MG PO TABS: 500.0000 mg | ORAL_TABLET | Freq: Two times a day (BID) | ORAL | 0 refills | Status: DC

## 2022-08-14 NOTE — ED Triage Notes (Signed)
Patient was released from prison November 2023.  No job, no insurance, no pcp

## 2022-08-14 NOTE — ED Provider Notes (Signed)
[facility]    CSN: [id number] Arrival date & time: 08/14/22  1024    HISTORY   Chief Complaint  Patient presents with   Back Pain   Medication Refill   HPI Michael Bauer is a pleasant, 42 y.o. male who presents to urgent care today. Patient has multiple concerns.  Requesting STD exam.  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  States he currently has no pcp.  Patient also complains of back pain worsening for the past 5 days, states this has been going on for about 3 years.  Patient states he has not had imaging of his back. Patient was released from prison November 2023 and currently remains unemployed. Prior medications discussed with provider, patient states he was taking ramipril and amlodipine for elevated blood pressure, blood pressure is essentially normal today.  Patient states he does not tolerate metformin.  Patient states he has never been prescribed an HMG CoA reductase inhibitor.  FSBG today was 173.  Patient states he ate an egg McMuffin and drank a cup of lemonade at [employer] prior to arrival.  The history is provided by the patient.   Past Medical History:  Diagnosis Date   Diabetes mellitus without complication (HCC)    Hypertension    Patient Active Problem List   Diagnosis Date Noted   Aspiration pneumonia (HCC) 05/22/2017   Acute respiratory failure (HCC) 05/22/2017   Acute encephalopathy 05/18/2017   Past Surgical History:  Procedure Laterality Date   NO PAST SURGERIES      Home Medications    Prior to Admission medications   Medication Sig Start Date End Date Taking? Authorizing Provider  acetaminophen (TYLENOL) 500 MG tablet Take 1,000 mg by mouth every 6 (six) hours as needed for mild pain or headache. Patient not taking: Reported on 08/14/2022    [provider]  AMLODIPINE BESYLATE PO Take by  mouth. Patient not taking: Reported on 08/14/2022    [provider]  ibuprofen (ADVIL) 600 MG tablet Take 1 tablet (600 mg total) by mouth every 6 (six) hours as needed. Patient not taking: Reported on 08/14/2022 02/15/22   Ellsworth Lennox, PA-C  lisinopril (PRINIVIL,ZESTRIL) 5 MG tablet Take 1 tablet (5 mg total) by mouth daily. Patient not taking: Reported on 08/14/2022 05/23/17   Synetta Fail, MD  LISINOPRIL-HYDROCHLOROTHIAZIDE PO Take by mouth. Patient not taking: Reported on 08/14/2022    [provider]  RAMIPRIL PO Take by mouth. Patient not taking: Reported on 08/14/2022    [provider]  tiZANidine (ZANAFLEX) 4 MG tablet Take 1 tablet (4 mg total) by mouth every 6 (six) hours as needed for muscle spasms. Patient not taking: Reported on 08/14/2022 02/15/22   Ellsworth Lennox, PA-C  UNKNOWN TO PATIENT Another HTN med "that starts with a 'P' " Patient not taking: Reported on 08/14/2022    [provider]  UNKNOWN TO PATIENT 36 units of insulin (cannot verify name) Patient not taking: Reported on 08/14/2022    [provider]    Family History Family History  Problem Relation Age of Onset   Hypertension Mother    Diabetes Mother    Social History Social History   Tobacco Use   Smoking status: Every Day    Packs/day: 1.00    Years: 20.00    Additional pack years: 0.00  Total pack years: 20.00    Types: Cigarettes   Smokeless tobacco: Never  Vaping Use   Vaping Use: Never used  Substance Use Topics   Alcohol use: Yes    Comment: Occasionally    Drug use: Yes    Types: Marijuana, Cocaine    Comment: No longer using cocaine   Allergies   Patient has no known allergies.  Review of Systems Review of Systems Pertinent findings revealed after performing a 14 point review of systems has been noted in the history of present illness.  Physical Exam Vital Signs BP 122/84 (BP Location: Right Arm) Comment (BP Location): large cuff   Pulse 89   Temp 98 F (36.7 C) (Oral)   Resp 20   SpO2 97%   No data found.  Physical Exam Vitals and nursing note reviewed.  Constitutional:      General: He is not in acute distress.    Appearance: Normal appearance. He is normal weight. He is not ill-appearing.  HENT:     Head: Normocephalic and atraumatic.  Eyes:     Extraocular Movements: Extraocular movements intact.     Conjunctiva/sclera: Conjunctivae normal.     Pupils: Pupils are equal, round, and reactive to light.  Cardiovascular:     Rate and Rhythm: Normal rate and regular rhythm.  Pulmonary:     Effort: Pulmonary effort is normal.     Breath sounds: Normal breath sounds.  Musculoskeletal:        General: Normal range of motion.     Cervical back: Normal range of motion and neck supple.     Lumbar back: Spasms, tenderness and bony tenderness present. Normal range of motion.  Skin:    General: Skin is warm and dry.  Neurological:     General: No focal deficit present.     Mental Status: He is alert and oriented to person, place, and time. Mental status is at baseline.  Psychiatric:        Mood and Affect: Mood normal.        Behavior: Behavior normal.        Thought Content: Thought content normal.        Judgment: Judgment normal.     Visual Acuity Right Eye Distance:   Left Eye Distance:   Bilateral Distance:    Right Eye Near:   Left Eye Near:    Bilateral Near:     UC Couse / Diagnostics / Procedures:     Radiology DG Lumbar Spine Complete  Result Date: 08/14/2022 CLINICAL DATA:  Three a history of low back pain with progression over the last 5 months. EXAM: LUMBAR SPINE - COMPLETE 4+ VIEW COMPARISON:  None Available. FINDINGS: Five non rib-bearing lumbar type vertebral bodies are present. Vertebral body heights are maintained. Loss of disc space noted at L4-5. Endplate spurring is present at L4-5 and L3-4. Bilateral facet hypertrophy is present L5-S1. IMPRESSION: 1. Degenerative changes in the  lower lumbar spine as described. 2. No acute abnormality. Electronically Signed   By: Marin Roberts M.D.   On: 08/14/2022 12:26    Procedures Procedures (including critical care time) EKG  Pending results:  Labs Reviewed  POCT FASTING CBG [facility] MANUAL ENTRY - Abnormal; Notable for the following components:      Result Value   POCT Glucose ([facility]) 173 (*)    All other components within normal limits  CYTOLOGY, (ORAL, ANAL, URETHRAL) ANCILLARY ONLY    Medications Ordered in UC: Medications - No data  to display  UC Diagnoses / Final Clinical Impressions(s)   I have reviewed the triage vital signs and the nursing notes.  Pertinent labs & imaging results that were available during my care of the patient were reviewed by me and considered in my medical decision making (see chart for details).    Final diagnoses:  Medication refill  Insulin dependent type 2 diabetes mellitus, controlled (HCC)  Screening examination for STD (sexually transmitted disease)  Degenerative disc disease, lumbar  Facet arthropathy, lumbosacral   Patient was provided with a refill of Lantus, pen needles, glucometer and test strips.  Patient also provided with an ACE inhibitor and HMG Co. a reductase inhibitor.   Patient was given instructions to locate a new PCP and schedule appointment as soon as possible.    Patient was advised of x-ray findings.  Patient was provided with a prescription for baclofen and naproxen for his lower back pain and advised to follow-up with PCP regarding this issue..  STD screening was performed, patient advised that the results be posted to their MyChart and if any of the results are positive, they will be notified by phone, further treatment will be provided as indicated based on results of STD screening.  Patient was advised to abstain from sexual intercourse until that they receive the results of their STD testing.  Patient was also advised to use condoms to protect themselves  from STD exposure.    Please see discharge instructions below for details of plan of care as provided to patient. ED Prescriptions     Medication Sig Dispense Auth. Provider   lisinopril (ZESTRIL) 2.5 MG tablet Take 1 tablet (2.5 mg total) by mouth daily. 90 tablet Theadora Rama Scales, PA-C   atorvastatin (LIPITOR) 40 MG tablet Take 1 tablet (40 mg total) by mouth at bedtime. 90 tablet Theadora Rama Scales, PA-C   insulin glargine (LANTUS SOLOSTAR) 100 UNIT/ML Solostar Pen Inject 18 Units into the skin daily. 15 mL Theadora Rama Scales, PA-C   Insulin Pen Needle (PEN NEEDLES) 32G X 4 MM MISC Use as directed for daily Lantus injections x 200 days. 200 each Theadora Rama Scales, PA-C   Accu-Chek Softclix Lancets lancets Use as instructed for twice daily fingerstick blood glucose checks. 200 each Theadora Rama Scales, PA-C   Blood Glucose Monitoring Suppl (ACCU-CHEK GUIDE) w/Device KIT Use twice daily for fingerstick blood glucose monitoring 1 kit Theadora Rama Scales, PA-C   glucose blood (ACCU-CHEK GUIDE) test strip Use as instructed for twice daily fingerstick blood glucose checks. 200 each Theadora Rama Scales, PA-C   naproxen (NAPROSYN) 500 MG tablet Take 1 tablet (500 mg total) by mouth 2 (two) times daily. 30 tablet Theadora Rama Scales, PA-C   baclofen (LIORESAL) 10 MG tablet Take 1 tablet (10 mg total) by mouth 3 (three) times daily for 7 days. 21 tablet Theadora Rama Scales, PA-C      PDMP not reviewed this encounter.  Disposition Upon Discharge:  Condition: stable for discharge home  Patient presented with concern for an acute illness with associated systemic symptoms and significant discomfort requiring urgent management. In my opinion, this is a condition that a prudent lay person (someone who possesses an average knowledge of health and medicine) may potentially expect to result in complications if not addressed urgently such as respiratory distress, impairment  of bodily function or dysfunction of bodily organs.   As such, the patient has been evaluated and assessed, work-up was performed and treatment was provided in alignment with urgent  care protocols and evidence based medicine.  Patient/parent/caregiver has been advised that the patient may require follow up for further testing and/or treatment if the symptoms continue in spite of treatment, as clinically indicated and appropriate.  Routine symptom specific, illness specific and/or disease specific instructions were discussed with the patient and/or caregiver at length.  Prevention strategies for avoiding STD exposure were also discussed.  The patient will follow up with their current PCP if and as advised. If the patient does not currently have a PCP we will assist them in obtaining one.   The patient may need specialty follow up if the symptoms continue, in spite of conservative treatment and management, for further workup, evaluation, consultation and treatment as clinically indicated and appropriate.  Patient/parent/caregiver verbalized understanding and agreement of plan as discussed.  All questions were addressed during visit.  Please see discharge instructions below for further details of plan.  Discharge Instructions:   Discharge Instructions      We have provided you with courtesy refills of your insulin, ACE inhibitor and provided with a prescription for a plaque stabilizing medication called atorvastatin.  ACE inhibitors and plaque stabilizing medications are very important for treatment of the progression of type 2 diabetes and for prevention of kidney failure and heart disease.  Please take 1 tablet of lisinopril every morning and take 1 tablet of atorvastatin every evening before bedtime.  Have also provided you with a new blood glucose monitor, test strips and lancets.  Please check your blood sugar levels twice daily, once upon rising and a second time 2 hours after your largest  meal of the day which is typically dinnertime.  Your goal for your morning blood sugar levels between 80 and 100 and your goal for your evening blood sugar level is less than 180.  Please be sure that you reach out to our appointment scheduling website using the QR code on the back page of this after visit summary to set up your appointment with a primary care provider for regular monitoring of your type 2 diabetes, blood pressure and follow-up of her lower back pain.  The results of your STD testing today which screens for gonorrhea, chlamydia, and trichomonas will be made posted to your MyChart account once it is complete.  This typically takes 2 to 4 days.  Please abstain from sexual intercourse of any kind, vaginal, oral or anal, until you have received the results of your STD testing.     If any of your results are abnormal, you will receive a phone call regarding treatment.  Prescriptions, if any are needed, will be provided for you at your pharmacy.     The results of the x-ray that we performed during your visit today showed severe degenerative disease of the disc between your fourth and fifth lumbar vertebra, this means that the disc is supposed to provide cushion between these 2 bones has almost completely disintegrated which can result in severe lower back pain.  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  The mainstay of therapy for musculoskeletal pain is reduction of inflammation and relaxation of tension which is causing inflammation.  Keep in mind, pain always begets more pain.  To help you stay ahead of your pain and inflammation, I have provided the following regimen for you:   During your visit today, you received an injection of a high-dose steroidal  anti-inflammatory medication that should significantly reduce your pain for the next 6 to 8 hours. Please begin taking  Tylenol 1000 mg 3 times daily (every 8 hours) as soon as you pick up your prescriptions from the pharmacy.  It is safe to take 3000 mg of Tylenol in a 24-hour.  Please do not exceed this amount.  Tylenol works best when taken on a scheduled basis.  Even after your back begins to feel better, I recommend that you continue taking Tylenol 3 times a day to prevent pain from returning.   This evening, you can begin taking baclofen 10 mg.  This is a highly effective muscle relaxer and antispasmodic which should continue to provide you with relaxation of your tense muscles, allow you to sleep well and to keep your pain under control.  You can continue taking this medication 3 times daily as you need to.  If you find that this medication makes you too sleepy, you can break them in half for your daytime doses and, if needed double them for your nighttime dose.  Do not take more than 30 mg of baclofen in a 24-hour period.   During the day, please set aside time to apply ice to the affected area 4 times daily for 20 minutes each application.  This can be achieved by using a bag of frozen peas or corn, a Ziploc bag filled with ice and water, or Ziploc bag filled with half rubbing alcohol and half Dawn dish detergent, frozen into a slush.  Please be careful not to apply ice directly to your skin, always place a soft cloth between you and the ice pack.  Over-the-counter products such as IcyHot and Biofreeze do not work nearly as well.   You are welcome to use topical anti-inflammatory creams such as Voltaren gel, capsaicin or Aspercreme as recommended.  These medications are available over-the-counter, please follow manufactures instructions for use.  As a courtesy, I provided you with a prescription for diclofenac in the event that your insurance will pay for this.   Please consider discussing referral to physical therapy with your primary care provider.  Physical therapist are very good at teasing out the underlying  cause of acute lower back pain and helping with prevention of future recurrences.   I also recommend that you remain out of work for the next several days, I provided you with a note to return to work in 3 days.  If you feel that you need this time extended, please follow-up with your primary care provider or return to urgent care for reevaluation so that we can provide you with a note for another 3 days.   Thank you for visiting urgent care today.  We appreciate the opportunity to participate in your care.        This office note has been dictated using Teaching laboratory technician.  Unfortunately, this method of dictation can sometimes lead to typographical or grammatical errors.  I apologize for your inconvenience in advance if this occurs.  Please do not hesitate to reach out to me if clarification is needed.       Theadora Rama Scales, PA-C 08/14/22 1751

## 2022-08-14 NOTE — ED Triage Notes (Signed)
Patient has multiple concerns.  Requesting STD exam.  Last insulin was 5 months ago.  No pcp.  Back pain for 2-5 day.  History of the same.

## 2022-08-14 NOTE — Discharge Instructions (Addendum)
We have provided you with courtesy refills of your insulin, ACE inhibitor and provided with a prescription for a plaque stabilizing medication called atorvastatin.  ACE inhibitors and plaque stabilizing medications are very important for treatment of the progression of type 2 diabetes and for prevention of kidney failure and heart disease.  Please take 1 tablet of lisinopril every morning and take 1 tablet of atorvastatin every evening before bedtime.  Have also provided you with a new blood glucose monitor, test strips and lancets.  Please check your blood sugar levels twice daily, once upon rising and a second time 2 hours after your largest meal of the day which is typically dinnertime.  Your goal for your morning blood sugar levels between 80 and 100 and your goal for your evening blood sugar level is less than 180.  Please be sure that you reach out to our appointment scheduling website using the QR code on the back page of this after visit summary to set up your appointment with a primary care provider for regular monitoring of your type 2 diabetes, blood pressure and follow-up of her lower back pain.  The results of your STD testing today which screens for gonorrhea, chlamydia, and trichomonas will be made posted to your MyChart account once it is complete.  This typically takes 2 to 4 days.  Please abstain from sexual intercourse of any kind, vaginal, oral or anal, until you have received the results of your STD testing.     If any of your results are abnormal, you will receive a phone call regarding treatment.  Prescriptions, if any are needed, will be provided for you at your pharmacy.     The results of the x-ray that we performed during your visit today showed severe degenerative disease of the disc between your fourth and fifth lumbar vertebra, this means that the disc is supposed to provide cushion between these 2 bones has almost completely disintegrated which can result in severe lower  back pain.  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  The mainstay of therapy for musculoskeletal pain is reduction of inflammation and relaxation of tension which is causing inflammation.  Keep in mind, pain always begets more pain.  To help you stay ahead of your pain and inflammation, I have provided the following regimen for you:   During your visit today, you received an injection of a high-dose steroidal anti-inflammatory medication that should significantly reduce your pain for the next 6 to 8 hours. Please begin taking Tylenol 1000 mg 3 times daily (every 8 hours) as soon as you pick up your prescriptions from the pharmacy.  It is safe to take 3000 mg of Tylenol in a 24-hour.  Please do not exceed this amount.  Tylenol works best when taken on a scheduled basis.  Even after your back begins to feel better, I recommend that you continue taking Tylenol 3 times a day to prevent pain from returning.   This evening, you can begin taking baclofen 10 mg.  This is a highly effective muscle relaxer and antispasmodic which should continue to provide you with relaxation of your tense muscles, allow you to sleep well and to keep your pain under control.  You can continue taking this medication 3 times daily as you need to.  If you find that this medication makes you too sleepy, you can break them  in half for your daytime doses and, if needed double them for your nighttime dose.  Do not take more than 30 mg of baclofen in a 24-hour period.   During the day, please set aside time to apply ice to the affected area 4 times daily for 20 minutes each application.  This can be achieved by using a bag of frozen peas or corn, a Ziploc bag filled with ice and water, or Ziploc bag filled with half rubbing alcohol and half Dawn dish detergent, frozen into a slush.  Please be careful not to apply ice  directly to your skin, always place a soft cloth between you and the ice pack.  Over-the-counter products such as IcyHot and Biofreeze do not work nearly as well.   You are welcome to use topical anti-inflammatory creams such as Voltaren gel, capsaicin or Aspercreme as recommended.  These medications are available over-the-counter, please follow manufactures instructions for use.  As a courtesy, I provided you with a prescription for diclofenac in the event that your insurance will pay for this.   Please consider discussing referral to physical therapy with your primary care provider.  Physical therapist are very good at teasing out the underlying cause of acute lower back pain and helping with prevention of future recurrences.   I also recommend that you remain out of work for the next several days, I provided you with a note to return to work in 3 days.  If you feel that you need this time extended, please follow-up with your primary care provider or return to urgent care for reevaluation so that we can provide you with a note for another 3 days.   Thank you for visiting urgent care today.  We appreciate the opportunity to participate in your care.

## 2022-08-16 LAB — CYTOLOGY, (ORAL, ANAL, URETHRAL) ANCILLARY ONLY
Chlamydia: NEGATIVE
Comment: NEGATIVE
Comment: NEGATIVE
Comment: NORMAL
Neisseria Gonorrhea: NEGATIVE
Trichomonas: NEGATIVE

## 2022-11-16 ENCOUNTER — Ambulatory Visit (INDEPENDENT_AMBULATORY_CARE_PROVIDER_SITE_OTHER): Payer: Medicaid Other | Admitting: Nurse Practitioner

## 2022-11-16 ENCOUNTER — Encounter: Payer: Self-pay | Admitting: Nurse Practitioner

## 2022-11-16 VITALS — BP 117/70 | HR 88 | Ht 69.5 in | Wt 240.0 lb

## 2022-11-16 DIAGNOSIS — Z720 Tobacco use: Secondary | ICD-10-CM

## 2022-11-16 DIAGNOSIS — E669 Obesity, unspecified: Secondary | ICD-10-CM

## 2022-11-16 DIAGNOSIS — M5136 Other intervertebral disc degeneration, lumbar region: Secondary | ICD-10-CM

## 2022-11-16 DIAGNOSIS — E1165 Type 2 diabetes mellitus with hyperglycemia: Secondary | ICD-10-CM

## 2022-11-16 DIAGNOSIS — F1910 Other psychoactive substance abuse, uncomplicated: Secondary | ICD-10-CM

## 2022-11-16 DIAGNOSIS — E785 Hyperlipidemia, unspecified: Secondary | ICD-10-CM

## 2022-11-16 DIAGNOSIS — I10 Essential (primary) hypertension: Secondary | ICD-10-CM | POA: Insufficient documentation

## 2022-11-16 LAB — POCT GLYCOSYLATED HEMOGLOBIN (HGB A1C): Hemoglobin A1C: 6.1 % — AB (ref 4.0–5.6)

## 2022-11-16 MED ORDER — LISINOPRIL 2.5 MG PO TABS
2.5000 mg | ORAL_TABLET | Freq: Every day | ORAL | 1 refills | Status: AC
Start: 2022-11-16 — End: 2023-11-29

## 2022-11-16 MED ORDER — METFORMIN HCL ER 500 MG PO TB24: 500.0000 mg | ORAL_TABLET | Freq: Every day | ORAL | 3 refills | Status: DC

## 2022-11-16 MED ORDER — ATORVASTATIN CALCIUM 40 MG PO TABS
40.0000 mg | ORAL_TABLET | Freq: Every day | ORAL | 1 refills | Status: DC
Start: 2022-11-16 — End: 2023-05-19

## 2022-11-16 MED ORDER — CYCLOBENZAPRINE HCL 5 MG PO TABS
5.0000 mg | ORAL_TABLET | Freq: Three times a day (TID) | ORAL | 0 refills | Status: DC | PRN
Start: 2022-11-16 — End: 2023-09-01

## 2022-11-16 MED ORDER — METHYLPREDNISOLONE NA SUC (PF) 40 MG IJ SOLR
40.0000 mg | Freq: Once | INTRAMUSCULAR | Status: AC
Start: 2022-11-16 — End: 2022-11-16
  Administered 2022-11-16: 40 mg via INTRAMUSCULAR

## 2022-11-16 MED ORDER — KETOROLAC TROMETHAMINE 60 MG/2ML IM SOLN
60.0000 mg | Freq: Once | INTRAMUSCULAR | Status: AC
Start: 2022-11-16 — End: 2022-11-16
  Administered 2022-11-16: 60 mg via INTRAMUSCULAR

## 2022-11-16 MED ORDER — METHYLPREDNISOLONE SODIUM SUCC 125 MG IJ SOLR
125.0000 mg | Freq: Once | INTRAMUSCULAR | Status: DC
Start: 2022-11-16 — End: 2022-11-16

## 2022-11-16 NOTE — Assessment & Plan Note (Signed)
Wt Readings from Last 3 Encounters:  11/16/22 240 lb (108.9 kg)  03/03/22 253 lb 8.5 oz (115 kg)  05/22/17 253 lb 8.5 oz (115 kg)   Body mass index is 34.93 kg/m.  Patient counseled on low-carb modified diet Encouraged to engage in regular moderate to vigorous exercises at least 150 minutes weekly

## 2022-11-16 NOTE — Assessment & Plan Note (Signed)
A1c 6.1 today Has not used insulin since last year We discussed starting metformin XR 500 mg daily to help with keeping his diabetes well-controlled CBG goals discussed Counseled on low-carb modified diet Checking urine microalbumin labs, LDL Referred for diabetic eye exam Will do diabetic foot exam at next visit

## 2022-11-16 NOTE — Progress Notes (Signed)
New Patient Office Visit  Subjective:  Patient ID: Michael Bauer, male    DOB: 09-10-1980  Age: 42 y.o. MRN: [id number]  CC:  Chief Complaint  Patient presents with   Establish Care    HPI Michael Bauer is a 42 y.o. male  has a past medical history of Cocaine use disorder (HCC), DDD (degenerative disc disease), lumbar, Diabetes mellitus without complication (HCC), Hyperlipidemia, Hypertension, Marijuana abuse, Obesity, and Tobacco use disorder.  Patient presents to establish care for his chronic medical conditions.  No previous PCP  Type 2 diabetes/HTN .was previously incarcerated and released 9 months ago.  While incarcerated he was on insulin for type 2 diabetes.  Has not used insulin since he was released.  Has atorvastatin 40 mg daily ordered but has not been taking the medication.  Takes lisinopril 2.5 mg daily for hypertension.  Denies chest pain shortness of breath edema, polyuria polyphagia polydipsia.  Tobacco use disorder.  Started smoking 1 pack of cigarettes daily since age 8.  Denies cough wheezing shortness of breath.  Sometimes uses cocaine and marijuana.  Cessation encouraged  Degenerative lumbar disc disease patient complains of chronic aching pain in the low back area worse on the left side.  Pain is worse in the morning and eases of during the day after moving around.  Currently has aching pain rated 6/10.  Does not want ibuprofen or Tylenol because it did not help.  Denies numbness, tingling urinary or bladder incontinence.  Has been applying ice as needed at home      Past Medical History:  Diagnosis Date   Cocaine use disorder (HCC)    DDD (degenerative disc disease), lumbar    Diabetes mellitus without complication (HCC)    Hyperlipidemia    Hypertension    Marijuana abuse    Obesity    Tobacco use disorder     Past Surgical History:  Procedure Laterality Date   NO PAST SURGERIES      Family History  Problem Relation Age of Onset    Hypertension Mother    Diabetes Mother    Heart attack Neg Hx    Stroke Neg Hx     Social History   Socioeconomic History   Marital status: Single    Spouse name: Not on file   Number of children: 1   Years of education: Not on file   Highest education level: Not on file  Occupational History   Not on file  Tobacco Use   Smoking status: Every Day    Current packs/day: 1.00    Average packs/day: 1 pack/day for 20.0 years (20.0 ttl pk-yrs)    Types: Cigarettes   Smokeless tobacco: Never  Vaping Use   Vaping status: Never Used  Substance and Sexual Activity   Alcohol use: Yes    Comment: Occasionally    Drug use: Yes    Types: Marijuana, Cocaine   Sexual activity: Yes  Other Topics Concern   Not on file  Social History Narrative   Lives with his girlfriend    Social Determinants of Health   Financial Resource Strain: Not on file  Food Insecurity: Not on file  Transportation Needs: Not on file  Physical Activity: Not on file  Stress: Not on file  Social Connections: Not on file  Intimate Partner Violence: Not on file    ROS Review of Systems  Constitutional:  Negative for activity change, appetite change, chills, diaphoresis, fatigue, fever and unexpected weight change.  HENT:  Negative for congestion, dental problem, drooling and ear discharge.   Eyes:  Negative for pain, discharge, redness and itching.  Respiratory:  Negative for apnea, cough, choking, chest tightness, shortness of breath and wheezing.   Cardiovascular: Negative.  Negative for chest pain, palpitations and leg swelling.  Gastrointestinal:  Negative for abdominal distention, abdominal pain, anal bleeding, blood in stool, constipation, diarrhea and vomiting.  Endocrine: Negative for polydipsia, polyphagia and polyuria.  Genitourinary:  Negative for difficulty urinating, flank pain, frequency and genital sores.  Musculoskeletal:  Positive for arthralgias and back pain. Negative for gait problem and  joint swelling.  Skin:  Negative for color change, pallor and rash.  Neurological:  Negative for dizziness, facial asymmetry, light-headedness, numbness and headaches.  Psychiatric/Behavioral:  Negative for agitation, behavioral problems, confusion, hallucinations, self-injury, sleep disturbance and suicidal ideas.     Objective:   Today's Vitals: BP 117/70   Pulse 88   Ht 5' 9.5" (1.765 m)   Wt 240 lb (108.9 kg)   SpO2 98%   BMI 34.93 kg/m   Physical Exam Vitals and nursing note reviewed.  Constitutional:      General: He is not in acute distress.    Appearance: Normal appearance. He is obese. He is not ill-appearing, toxic-appearing or diaphoretic.  HENT:     Mouth/Throat:     Mouth: Mucous membranes are moist.     Pharynx: Oropharynx is clear. No oropharyngeal exudate or posterior oropharyngeal erythema.  Eyes:     General: No scleral icterus.       Right eye: No discharge.        Left eye: No discharge.     Extraocular Movements: Extraocular movements intact.     Conjunctiva/sclera: Conjunctivae normal.  Cardiovascular:     Rate and Rhythm: Normal rate and regular rhythm.     Pulses: Normal pulses.     Heart sounds: Normal heart sounds. No murmur heard.    No friction rub. No gallop.  Pulmonary:     Effort: Pulmonary effort is normal. No respiratory distress.     Breath sounds: Normal breath sounds. No stridor. No wheezing, rhonchi or rales.  Chest:     Chest wall: No tenderness.  Abdominal:     General: There is no distension.     Palpations: Abdomen is soft.     Tenderness: There is no abdominal tenderness. There is no right CVA tenderness, left CVA tenderness or guarding.  Musculoskeletal:        General: Tenderness present. No swelling, deformity or signs of injury.     Right lower leg: No edema.     Left lower leg: No edema.     Comments: Tenderness on palpation of mid low back area.  No redness or swelling noted skin warm and dry.  Skin:    General: Skin  is warm and dry.     Capillary Refill: Capillary refill takes less than 2 seconds.     Coloration: Skin is not jaundiced or pale.     Findings: No bruising, erythema or lesion.  Neurological:     Mental Status: He is alert and oriented to person, place, and time.     Motor: No weakness.     Coordination: Coordination normal.     Gait: Gait normal.  Psychiatric:        Mood and Affect: Mood normal.        Behavior: Behavior normal.        Thought Content: Thought content normal.  Judgment: Judgment normal.     Assessment & Plan:   Problem List Items Addressed This Visit       Cardiovascular and Mediastinum   High blood pressure    BP Readings from Last 3 Encounters:  11/16/22 117/70  08/14/22 122/84  03/03/22 (!) 142/76   HTN Controlled on lisinopril 2.5 mg daily.  Medication refilled Continue current medications. No changes in management. Discussed DASH diet and dietary sodium restrictions Continue to increase dietary efforts and exercise.  CMP today        Relevant Medications   atorvastatin (LIPITOR) 40 MG tablet   lisinopril (ZESTRIL) 2.5 MG tablet     Endocrine   Type 2 diabetes mellitus with hyperglycemia, without long-term current use of insulin (HCC) - Primary    A1c 6.1 today Has not used insulin since last year We discussed starting metformin XR 500 mg daily to help with keeping his diabetes well-controlled CBG goals discussed Counseled on low-carb modified diet Checking urine microalbumin labs, LDL Referred for diabetic eye exam Will do diabetic foot exam at next visit       Relevant Medications   metFORMIN (GLUCOPHAGE-XR) 500 MG 24 hr tablet   atorvastatin (LIPITOR) 40 MG tablet   lisinopril (ZESTRIL) 2.5 MG tablet   Other Relevant Orders   Microalbumin/Creatinine Ratio, Urine   LDL Cholesterol, Direct   [id number]   CBC   Ambulatory referral to Ophthalmology     Musculoskeletal and Integument   Degenerative disc disease, lumbar      Toradol 60 mg injection, Solu-Medrol 40 mg injection given in the office today.  Does not want NSAID or Tylenol Flexeril 5 mg 3 times daily as needed ordered Stretching exercises, application of heat or ice encouraged Patient encouraged to lose weight Declined referral to orthopedics today   DG Lumbar Spine Complete (Accession [id number]) (Order [id number]) Imaging Date: 08/14/2022 Department: [facility] Released By/Authorizing: Theadora Rama Scales, PA-C (auto-released)   Exam Status  Status  Final [99]   PACS Intelerad Image Link   Show images for DG Lumbar Spine Complete Study Result  Narrative & Impression  CLINICAL DATA:  Three a history of low back pain with progression over the last 5 months.   EXAM: LUMBAR SPINE - COMPLETE 4+ VIEW   COMPARISON:  None Available.   FINDINGS: Five non rib-bearing lumbar type vertebral bodies are present. Vertebral body heights are maintained. Loss of disc space noted at L4-5. Endplate spurring is present at L4-5 and L3-4. Bilateral facet hypertrophy is present L5-S1.   IMPRESSION: 1. Degenerative changes in the lower lumbar spine as described. 2. No acute abnormality.          Relevant Medications   cyclobenzaprine (FLEXERIL) 5 MG tablet     Other   Tobacco abuse    Smokes about 1 pack/day  Asked about quitting: confirms that he/she currently smokes cigarettes Advise to quit smoking: Educated about QUITTING to reduce the risk of cancer, cardio and cerebrovascular disease. Assess willingness: Unwilling to quit at this time, but is working on cutting back. Assist with counseling and pharmacotherapy: Counseled for 5 minutes and literature provided. Arrange for follow up: follow up in 1 month and continue to offer help.       Substance abuse (HCC)    Cessation encouraged      Obesity (BMI 30-39.9)    Wt Readings from Last 3 Encounters:  11/16/22 240 lb (108.9 kg)  03/03/22 253 lb 8.5 oz  (115  kg)  05/22/17 253 lb 8.5 oz (115 kg)   Body mass index is 34.93 kg/m.  Patient counseled on low-carb modified diet Encouraged to engage in regular moderate to vigorous exercises at least 150 minutes weekly      Relevant Medications   metFORMIN (GLUCOPHAGE-XR) 500 MG 24 hr tablet   Dyslipidemia, goal LDL below 70    Restart atorvastatin 40 mg daily Not fasting,- LDL Cholesterol, Direct       Relevant Medications   atorvastatin (LIPITOR) 40 MG tablet   lisinopril (ZESTRIL) 2.5 MG tablet    Outpatient Encounter Medications as of 11/16/2022  Medication Sig   cyclobenzaprine (FLEXERIL) 5 MG tablet Take 1 tablet (5 mg total) by mouth 3 (three) times daily as needed for muscle spasms.   metFORMIN (GLUCOPHAGE-XR) 500 MG 24 hr tablet Take 1 tablet (500 mg total) by mouth daily with breakfast.   [DISCONTINUED] lisinopril (ZESTRIL) 2.5 MG tablet Take 1 tablet (2.5 mg total) by mouth daily.   Accu-Chek Softclix Lancets lancets Use as instructed for twice daily fingerstick blood glucose checks. (Patient not taking: Reported on 11/16/2022)   atorvastatin (LIPITOR) 40 MG tablet Take 1 tablet (40 mg total) by mouth at bedtime.   Blood Glucose Monitoring Suppl (ACCU-CHEK GUIDE) w/Device KIT Use twice daily for fingerstick blood glucose monitoring (Patient not taking: Reported on 11/16/2022)   glucose blood (ACCU-CHEK GUIDE) test strip Use as instructed for twice daily fingerstick blood glucose checks. (Patient not taking: Reported on 11/16/2022)   lisinopril (ZESTRIL) 2.5 MG tablet Take 1 tablet (2.5 mg total) by mouth daily.   naproxen (NAPROSYN) 500 MG tablet Take 1 tablet (500 mg total) by mouth 2 (two) times daily. (Patient not taking: Reported on 11/16/2022)   [DISCONTINUED] atorvastatin (LIPITOR) 40 MG tablet Take 1 tablet (40 mg total) by mouth at bedtime. (Patient not taking: Reported on 11/16/2022)   [DISCONTINUED] insulin glargine (LANTUS SOLOSTAR) 100 UNIT/ML Solostar Pen Inject 18 Units  into the skin daily. (Patient not taking: Reported on 11/16/2022)   [DISCONTINUED] Insulin Pen Needle (PEN NEEDLES) 32G X 4 MM MISC Use as directed for daily Lantus injections x 200 days. (Patient not taking: Reported on 11/16/2022)   No facility-administered encounter medications on file as of 11/16/2022.    Follow-up: Return in about 6 weeks (around 12/28/2022) for CPE.   Donell Beers, FNP

## 2022-11-16 NOTE — Assessment & Plan Note (Signed)
BP Readings from Last 3 Encounters:  11/16/22 117/70  08/14/22 122/84  03/03/22 (!) 142/76   HTN Controlled on lisinopril 2.5 mg daily.  Medication refilled Continue current medications. No changes in management. Discussed DASH diet and dietary sodium restrictions Continue to increase dietary efforts and exercise.  CMP today

## 2022-11-16 NOTE — Addendum Note (Signed)
Addended by: Renelda Loma on: 11/16/2022 11:31 AM   Modules accepted: Orders

## 2022-11-16 NOTE — Assessment & Plan Note (Signed)
Restart atorvastatin 40 mg daily Not fasting,- LDL Cholesterol, Direct

## 2022-11-16 NOTE — Assessment & Plan Note (Signed)
Cessation encouraged 

## 2022-11-16 NOTE — Patient Instructions (Addendum)
You were given Toradol 60 mg injection and Solu-Medrol 40 mg injection in the office today.  Let us know if you would like to see an orthopedics    1. Type 2 diabetes mellitus with hyperglycemia, without long-term current use of insulin (HCC)  Goal for fasting blood sugar ranges from 80 to 120 and 2 hours after any meal or at bedtime should be between 130 to 170.    - Microalbumin/Creatinine Ratio, Urine - metFORMIN (GLUCOPHAGE-XR) 500 MG 24 hr tablet; Take 1 tablet (500 mg total) by mouth daily with breakfast.  Dispense: 30 tablet; Refill: 3 - atorvastatin (LIPITOR) 40 MG tablet; Take 1 tablet (40 mg total) by mouth at bedtime.  Dispense: 90 tablet; Refill: 1 - lisinopril (ZESTRIL) 2.5 MG tablet; Take 1 tablet (2.5 mg total) by mouth daily.  Dispense: 90 tablet; Refill: 1 - LDL Cholesterol, Direct - [id number] - CBC  2. Degenerative disc disease, lumbar  - cyclobenzaprine (FLEXERIL) 5 MG tablet; Take 1 tablet (5 mg total) by mouth 3 (three) times daily as needed for muscle spasms.  Dispense: 30 tablet; Refill: 0  3. Tobacco abuse   4. Substance abuse (HCC)   5. Obesity (BMI 30-39.9)    It is important that you exercise regularly at least 30 minutes 5 times a week as tolerated  Think about what you will eat, plan ahead. Choose " clean, Gregg[employer], fresh or frozen" over canned, processed or packaged foods which are more sugary, salty and fatty. 70 to 75% of food eaten should be vegetables and fruit. Three meals at set times with snacks allowed between meals, but they must be fruit or vegetables. Aim to eat over a 12 hour period , example 7 am to 7 pm, and STOP after  your last meal of the day. Drink water,generally about 64 ounces per day, no other drink is as healthy. Fruit juice is best enjoyed in a healthy way, by EATING the fruit.  Thanks for choosing [facility] we consider it a privelige to serve you.

## 2022-11-16 NOTE — Assessment & Plan Note (Addendum)
Smokes about 1 pack/day  Asked about quitting: confirms that he/she currently smokes cigarettes Advise to quit smoking: Educated about QUITTING to reduce the risk of cancer, cardio and cerebrovascular disease. Assess willingness: Unwilling to quit at this time, but is working on cutting back. Assist with counseling and pharmacotherapy: Counseled for 5 minutes and literature provided. Arrange for follow up: follow up in 1 month and continue to offer help.  

## 2022-11-16 NOTE — Assessment & Plan Note (Signed)
  Toradol 60 mg injection, Solu-Medrol 40 mg injection given in the office today.  Does not want NSAID or Tylenol Flexeril 5 mg 3 times daily as needed ordered Stretching exercises, application of heat or ice encouraged Patient encouraged to lose weight Declined referral to orthopedics today   DG Lumbar Spine Complete (Accession [id number]) (Order [id number]) Imaging Date: 08/14/2022 Department: [facility] Released By/Authorizing: Theadora Rama Scales, PA-C (auto-released)   Exam Status  Status  Final [99]   PACS Intelerad Image Link   Show images for DG Lumbar Spine Complete Study Result  Narrative & Impression  CLINICAL DATA:  Three a history of low back pain with progression over the last 5 months.   EXAM: LUMBAR SPINE - COMPLETE 4+ VIEW   COMPARISON:  None Available.   FINDINGS: Five non rib-bearing lumbar type vertebral bodies are present. Vertebral body heights are maintained. Loss of disc space noted at L4-5. Endplate spurring is present at L4-5 and L3-4. Bilateral facet hypertrophy is present L5-S1.   IMPRESSION: 1. Degenerative changes in the lower lumbar spine as described. 2. No acute abnormality.

## 2022-11-17 LAB — CMP14+EGFR
ALT: 26 IU/L (ref 0–44)
AST: 20 IU/L (ref 0–40)
Albumin: 4.6 g/dL (ref 4.1–5.1)
Alkaline Phosphatase: 62 IU/L (ref 44–121)
BUN/Creatinine Ratio: 13 (ref 9–20)
BUN: 13 mg/dL (ref 6–24)
Bilirubin Total: 0.2 mg/dL (ref 0.0–1.2)
CO2: 25 mmol/L (ref 20–29)
Calcium: 9.7 mg/dL (ref 8.7–10.2)
Chloride: 103 mmol/L (ref 96–106)
Creatinine, Ser: 1 mg/dL (ref 0.76–1.27)
Globulin, Total: 2.9 g/dL (ref 1.5–4.5)
Glucose: 91 mg/dL (ref 70–99)
Potassium: 4.2 mmol/L (ref 3.5–5.2)
Sodium: 141 mmol/L (ref 134–144)
Total Protein: 7.5 g/dL (ref 6.0–8.5)
eGFR: 97 mL/min/{1.73_m2} (ref >59)

## 2022-11-17 LAB — CBC
Hematocrit: 48.3 % (ref 37.5–51.0)
Hemoglobin: 15.5 g/dL (ref 13.0–17.7)
MCH: 27.6 pg (ref 26.6–33.0)
MCHC: 32.1 g/dL (ref 31.5–35.7)
MCV: 86 fL (ref 79–97)
Platelets: 326 10*3/uL (ref 150–450)
RBC: 5.61 x10E6/uL (ref 4.14–5.80)
RDW: 12.6 % (ref 11.6–15.4)
WBC: 8.8 10*3/uL (ref 3.4–10.8)

## 2022-11-17 LAB — MICROALBUMIN / CREATININE URINE RATIO
Creatinine, Urine: 191 mg/dL
Microalb/Creat Ratio: 13 mg/g{creat} (ref 0–29)
Microalbumin, Urine: 24 ug/mL

## 2022-11-17 LAB — LDL CHOLESTEROL, DIRECT: LDL Direct: 96 mg/dL (ref 0–99)

## 2022-12-28 ENCOUNTER — Other Ambulatory Visit: Payer: Self-pay | Admitting: Nurse Practitioner

## 2022-12-28 ENCOUNTER — Ambulatory Visit (INDEPENDENT_AMBULATORY_CARE_PROVIDER_SITE_OTHER): Payer: Medicaid Other | Admitting: Nurse Practitioner

## 2022-12-28 ENCOUNTER — Encounter: Payer: Self-pay | Admitting: Nurse Practitioner

## 2022-12-28 VITALS — BP 139/81 | HR 88 | Temp 97.7°F | Wt 243.0 lb

## 2022-12-28 DIAGNOSIS — B353 Tinea pedis: Secondary | ICD-10-CM | POA: Diagnosis not present

## 2022-12-28 DIAGNOSIS — Z0000 Encounter for general adult medical examination without abnormal findings: Secondary | ICD-10-CM | POA: Diagnosis not present

## 2022-12-28 DIAGNOSIS — E1165 Type 2 diabetes mellitus with hyperglycemia: Secondary | ICD-10-CM | POA: Diagnosis not present

## 2022-12-28 DIAGNOSIS — Z720 Tobacco use: Secondary | ICD-10-CM

## 2022-12-28 DIAGNOSIS — E785 Hyperlipidemia, unspecified: Secondary | ICD-10-CM

## 2022-12-28 DIAGNOSIS — Z1159 Encounter for screening for other viral diseases: Secondary | ICD-10-CM

## 2022-12-28 DIAGNOSIS — M51361 Other intervertebral disc degeneration, lumbar region with lower extremity pain only: Secondary | ICD-10-CM

## 2022-12-28 DIAGNOSIS — I10 Essential (primary) hypertension: Secondary | ICD-10-CM

## 2022-12-28 MED ORDER — CLOTRIMAZOLE 1 % EX OINT
1.0000 | TOPICAL_OINTMENT | Freq: Two times a day (BID) | CUTANEOUS | 0 refills | Status: DC
Start: 2022-12-28 — End: 2022-12-28

## 2022-12-28 NOTE — Assessment & Plan Note (Signed)
BP Readings from Last 3 Encounters:  12/28/22 139/81  11/16/22 117/70  08/14/22 122/84  Systolic blood pressure is slightly elevated Currently on lisinopril 2.5 mg daily,Stated that he has been taking the medication when he remembers  and that he has not taken med today. Patient encouraged to take lisinopril 2.5 mg daily as ordered, Discussed DASH diet and dietary sodium restrictions Continue to increase dietary efforts and exercise.

## 2022-12-28 NOTE — Assessment & Plan Note (Signed)
Start  - Clotrimazole 1 % OINT; Apply 1 Application topically 2 (two) times daily.  Dispense: 56.7 g; Refill: 0 Need to keep both feet clean dry and well aerated discussed Educational materials provided

## 2022-12-28 NOTE — Patient Instructions (Addendum)
Tinea pedis of both feet  - Clotrimazole 1 % OINT; Apply 1 Application topically 2 (two) times daily.  Dispense: 56.7 g; Refill: 0  : Apply to affected and surrounding area(s) twice daily until 1 week after clinical resolution, typically for 4 weeks total     It is important that you exercise regularly at least 30 minutes 5 times a week as tolerated  Think about what you will eat, plan ahead. Choose " clean, [place], fresh or frozen" over canned, processed or packaged foods which are more sugary, salty and fatty. 70 to 75% of food eaten should be vegetables and fruit. Three meals at set times with snacks allowed between meals, but they must be fruit or vegetables. Aim to eat over a 12 hour period , example 7 am to 7 pm, and STOP after  your last meal of the day. Drink water,generally about 64 ounces per day, no other drink is as healthy. Fruit juice is best enjoyed in a healthy way, by EATING the fruit.  Thanks for choosing [facility] we consider it a privelige to serve you.

## 2022-12-28 NOTE — Assessment & Plan Note (Addendum)
Lab Results  Component Value Date   HGBA1C 6.1 (A) 11/16/2022  States that he has been trying to eat better, and does some walking exercises He has not been taking metformin, he wants to continue to make lifestyle changes for now Patient counseled on low-carb modified diet Encouraged engage in regular moderate to vigorous exercise at least 150 minutes weekly Diabetic foot exam completed, referral to ophthalmologist is pending Follow-up in 3 months

## 2022-12-28 NOTE — Assessment & Plan Note (Signed)
Annual exam as documented.  ?Counseling done include healthy lifestyle involving committing to 150 minutes of exercise per week, heart healthy diet, and attaining healthy weight. The importance of adequate sleep also discussed.  ?Regular use of seat belt and home safety were also discussed . ?Changes in health habits are decided on by patient with goals and time frames set for achieving them. ?Immunization and cancer screening  needs are specifically addressed at this visit.   ?

## 2022-12-28 NOTE — Assessment & Plan Note (Addendum)
Continues to smoke 1 pack of cigarettes daily  Declined medication to assist with smoking cessation Cessation encouraged

## 2022-12-28 NOTE — Assessment & Plan Note (Addendum)
Checking lipid panel toda continue atorvastatin 40 mg daily Stated that he has been taking the medication when he remembers to taking. Patient encouraged to take atorvastatin 40 mg daily as ordered

## 2022-12-28 NOTE — Assessment & Plan Note (Signed)
Currently has pain rated 6/10 Stated that Toradol injection and Solu-Medrol dose given at his last hospital visit was performed about 3 days.  He has been using an OTC pain patch that helps some He would like referral to orthopedics Patient encouraged to take OTC Tylenol or ibuprofen as needed Application of heat, ice encouraged

## 2022-12-28 NOTE — Progress Notes (Signed)
Complete physical exam  Patient: Michael Bauer   DOB: May 01, 1980   42 y.o. Male  MRN: [id number]  Subjective:    Chief Complaint  Patient presents with   Annual Exam    fasting    Michael Bauer is a 42 y.o. male  has a past medical history of Cocaine use disorder (HCC), DDD (degenerative disc disease), lumbar, Diabetes mellitus without complication (HCC), Hyperlipidemia, Hypertension, Marijuana abuse, Obesity, and Tobacco use disorder. who presents today for a complete physical exam. He reports  trying to eat better , does some walking exercises . He generally feels well. He reports sleeping well.   He declined flu vaccine, took TDAP vaccine [place]       139/81   Most recent fall risk assessment:     No data to display           Most recent depression screenings:    11/16/2022   10:05 AM  PHQ 2/9 Scores  PHQ - 2 Score 0        Patient Care Team: Donell Beers, FNP as PCP - General (Nurse Practitioner)   Outpatient Medications Prior to Visit  Medication Sig Note   atorvastatin (LIPITOR) 40 MG tablet Take 1 tablet (40 mg total) by mouth at bedtime. 12/28/2022: Forgets to take med sometimes   lisinopril (ZESTRIL) 2.5 MG tablet Take 1 tablet (2.5 mg total) by mouth daily.    Accu-Chek Softclix Lancets lancets Use as instructed for twice daily fingerstick blood glucose checks. (Patient not taking: Reported on 11/16/2022)    Blood Glucose Monitoring Suppl (ACCU-CHEK GUIDE) w/Device KIT Use twice daily for fingerstick blood glucose monitoring (Patient not taking: Reported on 11/16/2022)    cyclobenzaprine (FLEXERIL) 5 MG tablet Take 1 tablet (5 mg total) by mouth 3 (three) times daily as needed for muscle spasms. (Patient not taking: Reported on 12/28/2022)    glucose blood (ACCU-CHEK GUIDE) test strip Use as instructed for twice daily fingerstick blood glucose checks. (Patient not taking: Reported on 11/16/2022)    metFORMIN (GLUCOPHAGE-XR) 500 MG 24 hr tablet Take  1 tablet (500 mg total) by mouth daily with breakfast. (Patient not taking: Reported on 12/28/2022)    naproxen (NAPROSYN) 500 MG tablet Take 1 tablet (500 mg total) by mouth 2 (two) times daily. (Patient not taking: Reported on 11/16/2022)    No facility-administered medications prior to visit.    Review of Systems  Constitutional:  Negative for activity change, appetite change, chills, diaphoresis, fatigue, fever and unexpected weight change.  HENT:  Negative for congestion, dental problem, drooling and ear discharge.   Eyes:  Negative for pain, discharge, redness and itching.  Respiratory:  Negative for apnea, cough, choking, chest tightness, shortness of breath and wheezing.   Cardiovascular: Negative.  Negative for chest pain, palpitations and leg swelling.  Gastrointestinal:  Negative for abdominal distention, abdominal pain, anal bleeding, blood in stool, constipation, diarrhea and vomiting.  Endocrine: Negative for polydipsia, polyphagia and polyuria.  Genitourinary:  Negative for difficulty urinating, flank pain, frequency and genital sores.  Musculoskeletal:  Positive for back pain. Negative for gait problem and joint swelling.  Skin:  Negative for color change, pallor and rash.  Neurological:  Negative for dizziness, facial asymmetry, light-headedness, numbness and headaches.  Psychiatric/Behavioral:  Negative for agitation, behavioral problems, confusion, hallucinations, self-injury, sleep disturbance and suicidal ideas.        Objective:     BP 139/81   Pulse 88   Temp 97.7 F (36.5  C)   Wt 243 lb (110.2 kg)   SpO2 100%   BMI 35.37 kg/m    Physical Exam Vitals and nursing note reviewed. Exam conducted with a chaperone present.  Constitutional:      General: He is not in acute distress.    Appearance: Normal appearance. He is obese. He is not ill-appearing, toxic-appearing or diaphoretic.  HENT:     Right Ear: Tympanic membrane, ear canal and external ear normal.  There is no impacted cerumen.     Left Ear: Tympanic membrane, ear canal and external ear normal. There is no impacted cerumen.     Nose: Nose normal. No congestion or rhinorrhea.     Mouth/Throat:     Mouth: Mucous membranes are moist.     Pharynx: Oropharynx is clear. No oropharyngeal exudate or posterior oropharyngeal erythema.  Eyes:     General: No scleral icterus.       Right eye: No discharge.        Left eye: No discharge.     Extraocular Movements: Extraocular movements intact.     Conjunctiva/sclera: Conjunctivae normal.  Neck:     Vascular: No carotid bruit.  Cardiovascular:     Rate and Rhythm: Normal rate and regular rhythm.     Pulses: Normal pulses.     Heart sounds: Normal heart sounds. No murmur heard.    No friction rub. No gallop.  Pulmonary:     Effort: Pulmonary effort is normal. No respiratory distress.     Breath sounds: Normal breath sounds. No stridor. No wheezing, rhonchi or rales.  Chest:     Chest wall: No tenderness.  Abdominal:     General: Bowel sounds are normal. There is no distension.     Palpations: Abdomen is soft. There is no mass.     Tenderness: There is no abdominal tenderness. There is no right CVA tenderness, left CVA tenderness, guarding or rebound.     Hernia: No hernia is present.  Musculoskeletal:        General: Tenderness present. No swelling, deformity or signs of injury.     Cervical back: Normal range of motion and neck supple. No rigidity or tenderness.     Right lower leg: No edema.     Left lower leg: No edema.     Comments: Tenderness on palpation of low back area on the right side.  Patient able to ambulate without difficulty  Lymphadenopathy:     Cervical: No cervical adenopathy.  Skin:    General: Skin is warm and dry.     Capillary Refill: Capillary refill takes less than 2 seconds.     Coloration: Skin is not jaundiced or pale.     Findings: No bruising or erythema.     Comments: Maceration of skin noted in the  interdigital spaces of the toes Has thickened , discolored  great toe nails  Neurological:     Mental Status: He is alert and oriented to person, place, and time.     Cranial Nerves: No cranial nerve deficit.     Sensory: No sensory deficit.     Motor: No weakness.     Coordination: Coordination normal.     Gait: Gait normal.     Deep Tendon Reflexes: Reflexes normal.  Psychiatric:        Mood and Affect: Mood normal.        Behavior: Behavior normal.        Thought Content: Thought content normal.  Judgment: Judgment normal.     No results found for any visits on 12/28/22.     Assessment & Plan:    Routine Health Maintenance and Physical Exam   There is no immunization history on file for this patient.  Health Maintenance  Topic Date Due   OPHTHALMOLOGY EXAM  Never done   Hepatitis C Screening  Never done   DTaP/Tdap/Td (1 - Tdap) Never done   COVID-19 Vaccine (1 - 2023-24 season) Never done   INFLUENZA VACCINE  06/26/2023 (Originally 10/27/2022)   HEMOGLOBIN A1C  05/19/2023   Diabetic kidney evaluation - eGFR measurement  11/16/2023   Diabetic kidney evaluation - Urine ACR  11/16/2023   FOOT EXAM  12/28/2023   HIV Screening  Completed   HPV VACCINES  Aged Out    Discussed health benefits of physical activity, and encouraged him to engage in regular exercise appropriate for his age and condition.  Problem List Items Addressed This Visit       Cardiovascular and Mediastinum   High blood pressure    BP Readings from Last 3 Encounters:  12/28/22 139/81  11/16/22 117/70  08/14/22 122/84  Systolic blood pressure is slightly elevated Currently on lisinopril 2.5 mg daily,Stated that he has been taking the medication when he remembers  and that he has not taken med today. Patient encouraged to take lisinopril 2.5 mg daily as ordered, Discussed DASH diet and dietary sodium restrictions Continue to increase dietary efforts and exercise.           Endocrine    Type 2 diabetes mellitus with hyperglycemia, without long-term current use of insulin (HCC)    Lab Results  Component Value Date   HGBA1C 6.1 (A) 11/16/2022  States that he has been trying to eat better, and does some walking exercises He has not been taking metformin, he wants to continue to make lifestyle changes for now Patient counseled on low-carb modified diet Encouraged engage in regular moderate to vigorous exercise at least 150 minutes weekly Diabetic foot exam completed, referral to ophthalmologist is pending Follow-up in 3 months       Relevant Orders   Lipid panel     Musculoskeletal and Integument   Degenerative disc disease, lumbar    Currently has pain rated 6/10 Stated that Toradol injection and Solu-Medrol dose given at his last hospital visit was performed about 3 days.  He has been using an OTC pain patch that helps some He would like referral to orthopedics Patient encouraged to take OTC Tylenol or ibuprofen as needed Application of heat, ice encouraged      Relevant Orders   Ambulatory referral to Orthopedic Surgery   Tinea pedis of both feet    Start  - Clotrimazole 1 % OINT; Apply 1 Application topically 2 (two) times daily.  Dispense: 56.7 g; Refill: 0 Need to keep both feet clean dry and well aerated discussed Educational materials provided      Relevant Medications   Clotrimazole 1 % OINT     Other   Tobacco abuse    Continues to smoke 1 pack of cigarettes daily  Declined medication to assist with smoking cessation Cessation encouraged      Dyslipidemia, goal LDL below 70    Checking lipid panel toda continue atorvastatin 40 mg daily Stated that he has been taking the medication when he remembers to taking. Patient encouraged to take atorvastatin 40 mg daily as ordered      Relevant Orders   Lipid  panel   Annual physical exam - Primary    Annual exam as documented.  Counseling done include healthy lifestyle involving committing to 150  minutes of exercise per week, heart healthy diet, and attaining healthy weight. The importance of adequate sleep also discussed.  Regular use of seat belt and home safety were also discussed . Changes in health habits are decided on by patient with goals and time frames set for achieving them. Immunization and cancer screening  needs are specifically addressed at this visit.         Other Visit Diagnoses     Need for hepatitis C screening test       Relevant Orders   Hepatitis C antibody      Return in about 3 months (around 03/30/2023) for DM.     Donell Beers, FNP

## 2022-12-29 LAB — HEPATITIS C ANTIBODY: Hep C Virus Ab: NONREACTIVE

## 2022-12-29 LAB — LIPID PANEL
Chol/HDL Ratio: 5.1 {ratio} — ABNORMAL HIGH (ref 0.0–5.0)
Cholesterol, Total: 168 mg/dL (ref 100–199)
HDL: 33 mg/dL — ABNORMAL LOW (ref >39)
LDL Chol Calc (NIH): 100 mg/dL — ABNORMAL HIGH (ref 0–99)
Triglycerides: 205 mg/dL — ABNORMAL HIGH (ref 0–149)
VLDL Cholesterol Cal: 35 mg/dL (ref 5–40)

## 2023-01-02 ENCOUNTER — Other Ambulatory Visit: Payer: Self-pay

## 2023-01-02 ENCOUNTER — Encounter: Payer: Self-pay | Admitting: Physical Medicine and Rehabilitation

## 2023-01-02 ENCOUNTER — Ambulatory Visit: Payer: Medicaid Other | Admitting: Physical Medicine and Rehabilitation

## 2023-01-02 DIAGNOSIS — M546 Pain in thoracic spine: Secondary | ICD-10-CM | POA: Diagnosis not present

## 2023-01-02 DIAGNOSIS — M7918 Myalgia, other site: Secondary | ICD-10-CM | POA: Diagnosis not present

## 2023-01-02 DIAGNOSIS — M545 Low back pain, unspecified: Secondary | ICD-10-CM

## 2023-01-02 DIAGNOSIS — E785 Hyperlipidemia, unspecified: Secondary | ICD-10-CM

## 2023-01-02 DIAGNOSIS — G8929 Other chronic pain: Secondary | ICD-10-CM | POA: Diagnosis not present

## 2023-01-02 MED ORDER — MELOXICAM 15 MG PO TABS: 15.0000 mg | ORAL_TABLET | Freq: Every day | ORAL | 0 refills | Status: DC

## 2023-01-02 MED ORDER — METHOCARBAMOL 500 MG PO TABS: 500.0000 mg | ORAL_TABLET | Freq: Three times a day (TID) | ORAL | 0 refills | Status: DC

## 2023-01-02 NOTE — Progress Notes (Signed)
Functional Pain Scale - descriptive words and definitions  Distressing (6)    Pain is present/unable to complete most ADLs limited by pain/sleep is difficult and active distraction is only marginal. Moderate range order  Average Pain 6  Lower back pain on left side that radiates up to the left shoulder

## 2023-01-02 NOTE — Progress Notes (Signed)
Michael Bauer - 42 y.o. male MRN [id number]  Date of birth: July 02, 1980  Office Visit Note: Visit Date: 01/02/2023 PCP: Donell Beers, FNP Referred by: Donell Beers, FNP  Subjective: Chief Complaint  Patient presents with  . Lower Back - Pain   HPI: Michael Bauer is a 42 y.o. male who comes in today per the request of Edwin Dada, FNP for evaluation of chronic, worsening and severe left sided lower back pain radiating up to ribs and left thoracic region. Pain ongoing for 3 years, more recently pain has started radiating to left arm and left chest. His pain seems to be most severe in the morning, activity seems to help with his pain. He describes pain as sore, aching and tight sensation, currently rates as 5 out of 10. Some relief of pain with home exercise regimen, rest and use of medications. Minimal relief of pain with Naprosyn and Flexeril. Recent lumbar x-rays exhibits degenerative changes in the lower lumbar spine. No history of formal physical therapy. Patient denies focal weakness, numbness and tingling. No recent trauma or falls.   Review of Systems  Musculoskeletal:  Positive for back pain and myalgias.  Neurological:  Negative for tingling, sensory change, focal weakness and weakness.  All other systems reviewed and are negative.  Otherwise per HPI.  Assessment & Plan: Visit Diagnoses:    ICD-10-CM   1. Chronic left-sided low back pain without sciatica  M54.50 Ambulatory referral to Physical Therapy   G89.29     2. Chronic left-sided thoracic back pain  M54.6 Ambulatory referral to Physical Therapy   G89.29     3. Myofascial pain syndrome  M79.18 Ambulatory referral to Physical Therapy       Plan: Findings:  Chronic, worsening and severe left sided lower back pain radiating up to ribs and left thoracic region.  Patient continues to have severe pain despite good conservative therapies such as home exercise regimen, rest and use of medications.   Patient's clinical presentation and exam are consistent with myofascial pain syndrome, tenderness noted to left thoracic paraspinal region on exam today.  Next step is to place order for short course of formal physical therapy. I would like for him to become more physically active and really focus on strengthening his core.  Also discussed medication management and prescribed Meloxicam and Robaxin for him to try.  He is scheduled to start a new job at [employer] in the coming weeks, I instructed him to try the Robaxin at nighttime for the next several days to ensure this medication does not make him drowsy. He has no questions at this time. If his pain persists would consider obtaining thoracic x-rays and possible lumbar MRI imaging. No red flag symptoms noted upon exam today.     Meds & Orders:  Meds ordered this encounter  Medications  . meloxicam (MOBIC) 15 MG tablet    Sig: Take 1 tablet (15 mg total) by mouth daily.    Dispense:  30 tablet    Refill:  0  . methocarbamol (ROBAXIN) 500 MG tablet    Sig: Take 1 tablet (500 mg total) by mouth 3 (three) times daily.    Dispense:  90 tablet    Refill:  0    Orders Placed This Encounter  Procedures  . Ambulatory referral to Physical Therapy    Follow-up: Return for 8 week follow up.   Procedures: No procedures performed      Clinical History: CLINICAL DATA:  Three a history of low back pain with progression over the last 5 months.   EXAM: LUMBAR SPINE - COMPLETE 4+ VIEW   COMPARISON:  None Available.   FINDINGS: Five non rib-bearing lumbar type vertebral bodies are present. Vertebral body heights are maintained. Loss of disc space noted at L4-5. Endplate spurring is present at L4-5 and L3-4. Bilateral facet hypertrophy is present L5-S1.   IMPRESSION: 1. Degenerative changes in the lower lumbar spine as described. 2. No acute abnormality.     Electronically Signed   By: Marin Roberts M.D.   On: 08/14/2022 12:26    He reports that he has been smoking cigarettes. He has a 20 pack-year smoking history. He has never used smokeless tobacco.  Recent Labs    11/16/22 1125  HGBA1C 6.1*    Objective:  VS:  HT:    WT:   BMI:     BP:   HR: bpm  TEMP: ( )  RESP:  Physical Exam  Ortho Exam  Imaging: No results found.  Past Medical/Family/Surgical/Social History: Medications & Allergies reviewed per EMR, new medications updated. Patient Active Problem List   Diagnosis Date Noted  . Tinea pedis of both feet 12/28/2022  . Annual physical exam 12/28/2022  . Degenerative disc disease, lumbar 11/16/2022  . Type 2 diabetes mellitus with hyperglycemia, without long-term current use of insulin (HCC) 11/16/2022  . Tobacco abuse 11/16/2022  . Substance abuse (HCC) 11/16/2022  . Obesity (BMI 30-39.9) 11/16/2022  . Dyslipidemia, goal LDL below 70 11/16/2022  . High blood pressure 11/16/2022  . Aspiration pneumonia (HCC) 05/22/2017  . Acute encephalopathy 05/18/2017   Past Medical History:  Diagnosis Date  . Cocaine use disorder (HCC)   . DDD (degenerative disc disease), lumbar   . Diabetes mellitus without complication (HCC)   . Hyperlipidemia   . Hypertension   . Marijuana abuse   . Obesity   . Tobacco use disorder    Family History  Problem Relation Age of Onset  . Hypertension Mother   . Diabetes Mother   . Heart attack Neg Hx   . Stroke Neg Hx    Past Surgical History:  Procedure Laterality Date  . NO PAST SURGERIES     Social History   Occupational History  . Not on file  Tobacco Use  . Smoking status: Every Day    Current packs/day: 1.00    Average packs/day: 1 pack/day for 20.0 years (20.0 ttl pk-yrs)    Types: Cigarettes  . Smokeless tobacco: Never  Vaping Use  . Vaping status: Never Used  Substance and Sexual Activity  . Alcohol use: Yes    Comment: Occasionally   . Drug use: Yes    Types: Marijuana, Cocaine  . Sexual activity: Yes

## 2023-01-14 ENCOUNTER — Ambulatory Visit: Payer: Medicaid Other | Admitting: Physical Therapy

## 2023-01-27 ENCOUNTER — Ambulatory Visit: Payer: Medicaid Other | Admitting: Physical Therapy

## 2023-02-27 ENCOUNTER — Ambulatory Visit: Payer: Medicaid Other | Admitting: Physical Medicine and Rehabilitation

## 2023-03-01 ENCOUNTER — Other Ambulatory Visit: Payer: Medicaid Other

## 2023-03-01 DIAGNOSIS — E785 Hyperlipidemia, unspecified: Secondary | ICD-10-CM

## 2023-03-02 LAB — LIPID PANEL
Chol/HDL Ratio: 4.2 {ratio} (ref 0.0–5.0)
Cholesterol, Total: 129 mg/dL (ref 100–199)
HDL: 31 mg/dL — ABNORMAL LOW (ref >39)
LDL Chol Calc (NIH): 71 mg/dL (ref 0–99)
Triglycerides: 156 mg/dL — ABNORMAL HIGH (ref 0–149)
VLDL Cholesterol Cal: 27 mg/dL (ref 5–40)

## 2023-03-31 ENCOUNTER — Ambulatory Visit: Payer: Self-pay | Admitting: Nurse Practitioner

## 2023-04-12 ENCOUNTER — Telehealth: Payer: Self-pay | Admitting: Nurse Practitioner

## 2023-04-12 NOTE — Telephone Encounter (Signed)
 Sent to Lindalou Reus to find out if she can change the referral. [facility]

## 2023-04-12 NOTE — Telephone Encounter (Signed)
 Copied from CRM [id number]. Topic: General - Other >> Apr 12, 2023 12:12 PM Brynn Caras wrote: Reason for CRM: Faith calling from [facility] on behalf of this patient to confirm his insurance information. Provided her with the name of his insurance plan [place] [employer] [place] [employer], advised his insurance is not accepted at their facility.

## 2023-04-13 NOTE — Telephone Encounter (Signed)
Yes, I will route to a different provider who accepts the patient's insurance.  Thank you for letting me know.

## 2023-04-21 ENCOUNTER — Ambulatory Visit: Payer: Medicaid Other | Admitting: Nurse Practitioner

## 2023-05-01 ENCOUNTER — Ambulatory Visit: Payer: Medicaid Other | Admitting: Nurse Practitioner

## 2023-05-17 ENCOUNTER — Encounter: Payer: Self-pay | Admitting: Nurse Practitioner

## 2023-05-17 ENCOUNTER — Telehealth (INDEPENDENT_AMBULATORY_CARE_PROVIDER_SITE_OTHER): Payer: Medicaid Other | Admitting: Nurse Practitioner

## 2023-05-17 DIAGNOSIS — E1165 Type 2 diabetes mellitus with hyperglycemia: Secondary | ICD-10-CM

## 2023-05-17 NOTE — Assessment & Plan Note (Signed)
Lab Results  Component Value Date   HGBA1C 6.1 (A) 11/16/2022

## 2023-05-19 ENCOUNTER — Other Ambulatory Visit: Payer: Self-pay | Admitting: Nurse Practitioner

## 2023-05-19 DIAGNOSIS — E1165 Type 2 diabetes mellitus with hyperglycemia: Secondary | ICD-10-CM

## 2023-05-19 MED ORDER — METFORMIN HCL ER 500 MG PO TB24: 500.0000 mg | ORAL_TABLET | Freq: Every day | ORAL | 3 refills | Status: DC

## 2023-05-19 MED ORDER — ATORVASTATIN CALCIUM 40 MG PO TABS: 40.0000 mg | ORAL_TABLET | Freq: Every day | ORAL | 1 refills | Status: DC

## 2023-05-19 NOTE — Progress Notes (Signed)
 No show

## 2023-08-09 ENCOUNTER — Ambulatory Visit: Admitting: Nurse Practitioner

## 2023-09-01 ENCOUNTER — Encounter: Payer: Self-pay | Admitting: Nurse Practitioner

## 2023-09-01 ENCOUNTER — Ambulatory Visit: Admitting: Nurse Practitioner

## 2023-09-01 VITALS — BP 120/62 | HR 87 | Temp 97.7°F | Wt 251.0 lb

## 2023-09-01 DIAGNOSIS — E669 Obesity, unspecified: Secondary | ICD-10-CM

## 2023-09-01 DIAGNOSIS — I10 Essential (primary) hypertension: Secondary | ICD-10-CM

## 2023-09-01 DIAGNOSIS — G8929 Other chronic pain: Secondary | ICD-10-CM

## 2023-09-01 DIAGNOSIS — F17210 Nicotine dependence, cigarettes, uncomplicated: Secondary | ICD-10-CM

## 2023-09-01 DIAGNOSIS — E1165 Type 2 diabetes mellitus with hyperglycemia: Secondary | ICD-10-CM | POA: Diagnosis not present

## 2023-09-01 DIAGNOSIS — M79671 Pain in right foot: Secondary | ICD-10-CM | POA: Diagnosis not present

## 2023-09-01 DIAGNOSIS — E785 Hyperlipidemia, unspecified: Secondary | ICD-10-CM | POA: Diagnosis not present

## 2023-09-01 DIAGNOSIS — Z720 Tobacco use: Secondary | ICD-10-CM

## 2023-09-01 LAB — POCT GLYCOSYLATED HEMOGLOBIN (HGB A1C): Hemoglobin A1C: 7.2 % — AB (ref 4.0–5.6)

## 2023-09-01 MED ORDER — ATORVASTATIN CALCIUM 40 MG PO TABS: 40.0000 mg | ORAL_TABLET | Freq: Every day | ORAL | 1 refills | Status: DC

## 2023-09-01 MED ORDER — CYCLOBENZAPRINE HCL 5 MG PO TABS: 5.0000 mg | ORAL_TABLET | Freq: Three times a day (TID) | ORAL | 0 refills | Status: AC | PRN

## 2023-09-01 MED ORDER — TRULICITY 0.75 MG/0.5ML ~~LOC~~ SOAJ: 0.7500 mg | SUBCUTANEOUS | 0 refills | Status: DC

## 2023-09-01 MED ORDER — IBUPROFEN 800 MG PO TABS
800.0000 mg | ORAL_TABLET | Freq: Three times a day (TID) | ORAL | 0 refills | Status: AC | PRN
Start: 2023-09-01 — End: ?

## 2023-09-01 NOTE — Assessment & Plan Note (Signed)
 He has been out of atorvastatin  for months Checking direct LDL Encouraged to restart atorvastatin  40 mg daily, medication refilled Lab Results  Component Value Date   CHOL 129 03/01/2023   HDL 31 (L) 03/01/2023   LDLCALC 71 03/01/2023   LDLDIRECT 96 11/16/2022   TRIG 156 (H) 03/01/2023   CHOLHDL 4.2 03/01/2023

## 2023-09-01 NOTE — Assessment & Plan Note (Signed)
 Patient complains of chronic left heel pain , stated that he injured his heel while playing basketball some years ago Heel pain has become worse recently currently rated 10/10 Not taking any medication for his pain, he denies numbness, tingling Patient declined Toradol  and Solu-Medrol  injection in the office today Start ibuprofen  800 mg 3 times daily as needed alternate with Tylenol  650 mg every 6 hours as needed Encouraged to wear comfortable shoes apply ice as needed Patient referred to podiatry

## 2023-09-01 NOTE — Patient Instructions (Addendum)
 [facility] [address] [place] [address]  P:  [phone] F:  [phone]  Goal for fasting blood sugar ranges from 80 to 120 and 2 hours after any meal or at bedtime should be between 130 to 170.     1. Type 2 diabetes mellitus with hyperglycemia, without long-term current use of insulin  (HCC) (Primary)  - POCT glycosylated hemoglobin (Hb A1C) - Basic Metabolic Panel - Direct LDL - atorvastatin  (LIPITOR) 40 MG tablet; Take 1 tablet (40 mg total) by mouth at bedtime.  Dispense: 90 tablet; Refill: 1 - Dulaglutide (TRULICITY) 0.75 MG/0.5ML SOAJ; Inject 0.75 mg into the skin once a week.  Dispense: 2 mL; Refill: 0  2. Chronic heel pain, right  - cyclobenzaprine  (FLEXERIL ) 5 MG tablet; Take 1 tablet (5 mg total) by mouth 3 (three) times daily as needed for muscle spasms.  Dispense: 30 tablet; Refill: 0 - ibuprofen  (ADVIL ) 800 MG tablet; Take 1 tablet (800 mg total) by mouth every 8 (eight) hours as needed.  Dispense: 30 tablet; Refill: 0   It is important that you exercise regularly at least 30 minutes 5 times a week as tolerated  Think about what you will eat, plan ahead. Choose " clean, Bach, fresh or frozen" over canned, processed or packaged foods which are more sugary, salty and fatty. 70 to 75% of food eaten should be vegetables and fruit. Three meals at set times with snacks allowed between meals, but they must be fruit or vegetables. Aim to eat over a 12 hour period , example 7 am to 7 pm, and STOP after  your last meal of the day. Drink water,generally about 64 ounces per day, no other drink is as healthy. Fruit juice is best enjoyed in a healthy way, by EATING the fruit.  Thanks for choosing [facility] we consider it a privelige to serve you.

## 2023-09-01 NOTE — Assessment & Plan Note (Signed)
 Smokes about 1 pack/day  Asked about quitting: confirms that he currently smokes cigarettes Advise to quit smoking: Educated about QUITTING to reduce the risk of cancer, cardio and cerebrovascular disease. Assess willingness: Unwilling to quit at this time, not working on cutting back. Assist with counseling and pharmacotherapy: Counseled for 5 minutes and literature provided. Arrange for follow up: follow up in 1 months and continue to offer help.

## 2023-09-01 NOTE — Assessment & Plan Note (Addendum)
 Chronic uncontrolled condition Has stopped taking metformin  due to GI side effects of the medication He is on a general diet and does not exercise Start Trulicity 0.75 mg once weekly injection Patient denies personal or family history of MTC or MEN 2.  They denied personal history of pancreatitis.  Patient encouraged to avoid fatty fried foods eat smaller portions of meal to help decrease nausea.  Encouraged to report abdominal pain, nausea, vomiting.  Patient counseled on low-carb diet CBG goals provided Need for diabetes eye exam discussed encouraged to get the exam completed Follow-up in 4 weeks

## 2023-09-01 NOTE — Progress Notes (Signed)
 Established Patient Office Visit  Subjective:  Patient ID: Michael Bauer, male    DOB: November 28, 1980  Age: 43 y.o. MRN: [id number]  CC:  Chief Complaint  Patient presents with   Back Pain    Along with  right foot pain (heel)      HPI Michael Bauer is a 43 y.o. male  has a past medical history of Cocaine use disorder (HCC), DDD (degenerative disc disease), lumbar, Diabetes mellitus without complication (HCC), Hyperlipidemia, Hypertension, Marijuana abuse, Obesity, and Tobacco use disorder.  Patient presents for follow-up for his chronic medical conditions He has stopped taking all his medications  Please see assessment and plan section for full BPH.  And plan    Past Medical History:  Diagnosis Date   Cocaine use disorder (HCC)    DDD (degenerative disc disease), lumbar    Diabetes mellitus without complication (HCC)    Hyperlipidemia    Hypertension    Marijuana abuse    Obesity    Tobacco use disorder     Past Surgical History:  Procedure Laterality Date   NO PAST SURGERIES      Family History  Problem Relation Age of Onset   Hypertension Mother    Diabetes Mother    Heart attack Neg Hx    Stroke Neg Hx     Social History   Socioeconomic History   Marital status: Single    Spouse name: Not on file   Number of children: 1   Years of education: Not on file   Highest education level: GED or equivalent  Occupational History   Not on file  Tobacco Use   Smoking status: Every Day    Current packs/day: 1.00    Average packs/day: 1 pack/day for 20.0 years (20.0 ttl pk-yrs)    Types: Cigarettes   Smokeless tobacco: Never  Vaping Use   Vaping status: Never Used  Substance and Sexual Activity   Alcohol use: Yes    Comment: Occasionally    Drug use: Yes    Types: Marijuana, Cocaine   Sexual activity: Yes  Other Topics Concern   Not on file  Social History Narrative   Lives with his girlfriend    Social Drivers of Health   Financial Resource  Strain: Medium Risk (05/13/2023)   Overall Financial Resource Strain (CARDIA)    Difficulty of Paying Living Expenses: Somewhat hard  Food Insecurity: Food Insecurity Present (05/13/2023)   Hunger Vital Sign    Worried About Running Out of Food in the Last Year: Sometimes true    Ran Out of Food in the Last Year: Patient declined  Transportation Needs: No Transportation Needs (05/13/2023)   PRAPARE - Administrator, Civil Service (Medical): No    Lack of Transportation (Non-Medical): No  Physical Activity: Unknown (05/13/2023)   Exercise Vital Sign    Days of Exercise per Week: 0 days    Minutes of Exercise per Session: Not on file  Stress: Stress Concern Present (05/13/2023)   [employer] of Occupational Health - Occupational Stress Questionnaire    Feeling of Stress : To some extent  Social Connections: Unknown (05/13/2023)   Social Connection and Isolation Panel [NHANES]    Frequency of Communication with Friends and Family: More than three times a week    Frequency of Social Gatherings with Friends and Family: More than three times a week    Attends Religious Services: More than 4 times per year    Active Member  of Clubs or Organizations: No    Attends Banker Meetings: Not on file    Marital Status: Patient declined  Intimate Partner Violence: Not on file    Outpatient Medications Prior to Visit  Medication Sig Dispense Refill   lisinopril  (ZESTRIL ) 2.5 MG tablet Take 1 tablet (2.5 mg total) by mouth daily. 90 tablet 1   Accu-Chek Softclix Lancets lancets Use as instructed for twice daily fingerstick blood glucose checks. (Patient not taking: Reported on 11/16/2022) 200 each 0   ALEVAZOL 1 % OINT APPLY TO AFFECTED AREA TWICE A DAY (Patient not taking: Reported on 09/01/2023) 56.7 g 0   Blood Glucose Monitoring Suppl (ACCU-CHEK GUIDE) w/Device KIT Use twice daily for fingerstick blood glucose monitoring (Patient not taking: Reported on 11/16/2022) 1 kit 0    glucose blood (ACCU-CHEK GUIDE) test strip Use as instructed for twice daily fingerstick blood glucose checks. (Patient not taking: Reported on 11/16/2022) 200 each 0   atorvastatin  (LIPITOR) 40 MG tablet Take 1 tablet (40 mg total) by mouth at bedtime. (Patient not taking: Reported on 09/01/2023) 90 tablet 1   cyclobenzaprine  (FLEXERIL ) 5 MG tablet Take 1 tablet (5 mg total) by mouth 3 (three) times daily as needed for muscle spasms. (Patient not taking: Reported on 12/28/2022) 30 tablet 0   meloxicam  (MOBIC ) 15 MG tablet Take 1 tablet (15 mg total) by mouth daily. (Patient not taking: Reported on 09/01/2023) 30 tablet 0   metFORMIN  (GLUCOPHAGE -XR) 500 MG 24 hr tablet Take 1 tablet (500 mg total) by mouth daily with breakfast. (Patient not taking: Reported on 09/01/2023) 30 tablet 3   methocarbamol  (ROBAXIN ) 500 MG tablet Take 1 tablet (500 mg total) by mouth 3 (three) times daily. (Patient not taking: Reported on 09/01/2023) 90 tablet 0   naproxen  (NAPROSYN ) 500 MG tablet Take 1 tablet (500 mg total) by mouth 2 (two) times daily. (Patient not taking: Reported on 11/16/2022) 30 tablet 0   No facility-administered medications prior to visit.    Allergies  Allergen Reactions   Metformin  And Related     Abdominal pain    ROS Review of Systems  Constitutional:  Negative for appetite change, chills, fatigue and fever.  HENT:  Negative for congestion, postnasal drip, rhinorrhea and sneezing.   Respiratory:  Negative for cough, shortness of breath and wheezing.   Cardiovascular:  Negative for chest pain, palpitations and leg swelling.  Gastrointestinal:  Negative for abdominal pain, constipation, nausea and vomiting.  Genitourinary:  Negative for difficulty urinating, dysuria, flank pain and frequency.  Musculoskeletal:  Positive for arthralgias. Negative for back pain, joint swelling and myalgias.  Skin:  Negative for color change, pallor, rash and wound.  Neurological:  Negative for dizziness, facial  asymmetry, weakness, numbness and headaches.  Psychiatric/Behavioral:  Negative for behavioral problems, confusion, self-injury and suicidal ideas.       Objective:     Physical Exam Vitals and nursing note reviewed.  Constitutional:      General: He is not in acute distress.    Appearance: Normal appearance. He is obese. He is not ill-appearing, toxic-appearing or diaphoretic.  Eyes:     General: No scleral icterus.       Right eye: No discharge.        Left eye: No discharge.     Extraocular Movements: Extraocular movements intact.     Conjunctiva/sclera: Conjunctivae normal.  Cardiovascular:     Rate and Rhythm: Normal rate and regular rhythm.     Pulses: Normal  pulses.     Heart sounds: Normal heart sounds. No murmur heard.    No friction rub. No gallop.  Pulmonary:     Effort: Pulmonary effort is normal. No respiratory distress.     Breath sounds: Normal breath sounds. No stridor. No wheezing, rhonchi or rales.  Chest:     Chest wall: No tenderness.  Abdominal:     General: There is no distension.     Palpations: Abdomen is soft.     Tenderness: There is no abdominal tenderness. There is no right CVA tenderness, left CVA tenderness or guarding.  Musculoskeletal:        General: Tenderness present. No swelling, deformity or signs of injury.     Right lower leg: No edema.     Left lower leg: No edema.     Comments: Tenderness on palpation of right heel, has a palpable pedal pulse skin is warm and dry no redness or swelling noted  Skin:    General: Skin is warm and dry.     Coloration: Skin is not jaundiced or pale.     Findings: No bruising, erythema or lesion.  Neurological:     Mental Status: He is alert and oriented to person, place, and time.     Motor: No weakness.     Gait: Gait normal.  Psychiatric:        Mood and Affect: Mood normal.        Behavior: Behavior normal.        Thought Content: Thought content normal.        Judgment: Judgment normal.      BP 120/62   Pulse 87   Temp 97.7 F (36.5 C)   Wt 251 lb (113.9 kg)   SpO2 97%   BMI 37.07 kg/m  Wt Readings from Last 3 Encounters:  09/01/23 251 lb (113.9 kg)  05/17/23 245 lb (111.1 kg)  12/28/22 243 lb (110.2 kg)    No results found for: "TSH" Lab Results  Component Value Date   WBC 8.8 11/16/2022   HGB 15.5 11/16/2022   HCT 48.3 11/16/2022   MCV 86 11/16/2022   PLT 326 11/16/2022   Lab Results  Component Value Date   NA 141 11/16/2022   K 4.2 11/16/2022   CO2 25 11/16/2022   GLUCOSE 91 11/16/2022   BUN 13 11/16/2022   CREATININE 1.00 11/16/2022   BILITOT 0.2 11/16/2022   ALKPHOS 62 11/16/2022   AST 20 11/16/2022   ALT 26 11/16/2022   PROT 7.5 11/16/2022   ALBUMIN 4.6 11/16/2022   CALCIUM  9.7 11/16/2022   ANIONGAP 10 03/03/2022   EGFR 97 11/16/2022   Lab Results  Component Value Date   CHOL 129 03/01/2023   Lab Results  Component Value Date   HDL 31 (L) 03/01/2023   Lab Results  Component Value Date   LDLCALC 71 03/01/2023   Lab Results  Component Value Date   TRIG 156 (H) 03/01/2023   Lab Results  Component Value Date   CHOLHDL 4.2 03/01/2023   Lab Results  Component Value Date   HGBA1C 6.1 (A) 11/16/2022      Assessment & Plan:   Problem List Items Addressed This Visit       Cardiovascular and Mediastinum   High blood pressure   Has not been taking lisinopril  and blood pressure is well-controlled  commitment to daily physical activity for a minimum of 30 minutes discussed and encouraged, as a part of hypertension management. The  importance of attaining a healthy weight is also discussed.     09/01/2023   11:31 AM 05/17/2023   11:01 AM 12/28/2022   11:48 AM 12/28/2022   11:40 AM 12/28/2022   11:17 AM 11/16/2022   10:04 AM 08/14/2022   11:47 AM  BP/Weight  Systolic BP 120  782 128 131 117 122  Diastolic BP 62  81 91 87 70 84  Wt. (Lbs) 251 245   243 240   BMI 37.07 kg/m2 36.18 kg/m2   35.37 kg/m2 34.93 kg/m2             Relevant Medications   atorvastatin  (LIPITOR) 40 MG tablet     Endocrine   Type 2 diabetes mellitus with hyperglycemia, without long-term current use of insulin  (HCC) - Primary   Chronic uncontrolled condition Has stopped taking metformin  due to GI side effects of the medication He is on a general diet and does not exercise Start Trulicity 0.75 mg once weekly injection Patient denies personal or family history of MTC or MEN 2.  They denied personal history of pancreatitis.  Patient encouraged to avoid fatty fried foods eat smaller portions of meal to help decrease nausea.  Encouraged to report abdominal pain, nausea, vomiting.  Patient counseled on low-carb diet CBG goals provided Need for diabetes eye exam discussed encouraged to get the exam completed Follow-up in 4 weeks      Relevant Medications   atorvastatin  (LIPITOR) 40 MG tablet   Dulaglutide (TRULICITY) 0.75 MG/0.5ML SOAJ   Other Relevant Orders   POCT glycosylated hemoglobin (Hb A1C)   Basic Metabolic Panel   Direct LDL     Other   Tobacco abuse   Smokes about 1 pack/day  Asked about quitting: confirms that he currently smokes cigarettes Advise to quit smoking: Educated about QUITTING to reduce the risk of cancer, cardio and cerebrovascular disease. Assess willingness: Unwilling to quit at this time, not working on cutting back. Assist with counseling and pharmacotherapy: Counseled for 5 minutes and literature provided. Arrange for follow up: follow up in 1 months and continue to offer help.       Obesity (BMI 30-39.9)   Wt Readings from Last 3 Encounters:  09/01/23 251 lb (113.9 kg)  05/17/23 245 lb (111.1 kg)  12/28/22 243 lb (110.2 kg)   Body mass index is 37.07 kg/m.  Patient counseled on low-carb diet Encouraged to engage in regular moderate exercise at least 150 minutes weekly as tolerated Starting Trulicity for type 2 diabetes      Relevant Medications   Dulaglutide (TRULICITY) 0.75 MG/0.5ML SOAJ    Dyslipidemia, goal LDL below 70   He has been out of atorvastatin  for months Checking direct LDL Encouraged to restart atorvastatin  40 mg daily, medication refilled Lab Results  Component Value Date   CHOL 129 03/01/2023   HDL 31 (L) 03/01/2023   LDLCALC 71 03/01/2023   LDLDIRECT 96 11/16/2022   TRIG 156 (H) 03/01/2023   CHOLHDL 4.2 03/01/2023         Relevant Medications   atorvastatin  (LIPITOR) 40 MG tablet   Chronic heel pain, right   Patient complains of chronic left heel pain , stated that he injured his heel while playing basketball some years ago Heel pain has become worse recently currently rated 10/10 Not taking any medication for his pain, he denies numbness, tingling Patient declined Toradol  and Solu-Medrol  injection in the office today Start ibuprofen  800 mg 3 times daily as needed alternate with Tylenol  650 mg  every 6 hours as needed Encouraged to wear comfortable shoes apply ice as needed Patient referred to podiatry      Relevant Medications   cyclobenzaprine  (FLEXERIL ) 5 MG tablet   ibuprofen  (ADVIL ) 800 MG tablet   Other Relevant Orders   Ambulatory referral to Podiatry    Meds ordered this encounter  Medications   atorvastatin  (LIPITOR) 40 MG tablet    Sig: Take 1 tablet (40 mg total) by mouth at bedtime.    Dispense:  90 tablet    Refill:  1   cyclobenzaprine  (FLEXERIL ) 5 MG tablet    Sig: Take 1 tablet (5 mg total) by mouth 3 (three) times daily as needed for muscle spasms.    Dispense:  30 tablet    Refill:  0   ibuprofen  (ADVIL ) 800 MG tablet    Sig: Take 1 tablet (800 mg total) by mouth every 8 (eight) hours as needed.    Dispense:  30 tablet    Refill:  0   Dulaglutide (TRULICITY) 0.75 MG/0.5ML SOAJ    Sig: Inject 0.75 mg into the skin once a week.    Dispense:  2 mL    Refill:  0    Follow-up: Return in about 4 weeks (around 09/29/2023) for DM.    Mel Langan R Ariba Lehnen, FNP

## 2023-09-01 NOTE — Assessment & Plan Note (Signed)
 Has not been taking lisinopril  and blood pressure is well-controlled  commitment to daily physical activity for a minimum of 30 minutes discussed and encouraged, as a part of hypertension management. The importance of attaining a healthy weight is also discussed.     09/01/2023   11:31 AM 05/17/2023   11:01 AM 12/28/2022   11:48 AM 12/28/2022   11:40 AM 12/28/2022   11:17 AM 11/16/2022   10:04 AM 08/14/2022   11:47 AM  BP/Weight  Systolic BP 120  161 128 131 117 122  Diastolic BP 62  81 91 87 70 84  Wt. (Lbs) 251 245   243 240   BMI 37.07 kg/m2 36.18 kg/m2   35.37 kg/m2 34.93 kg/m2

## 2023-09-01 NOTE — Assessment & Plan Note (Signed)
 Wt Readings from Last 3 Encounters:  09/01/23 251 lb (113.9 kg)  05/17/23 245 lb (111.1 kg)  12/28/22 243 lb (110.2 kg)   Body mass index is 37.07 kg/m.  Patient counseled on low-carb diet Encouraged to engage in regular moderate exercise at least 150 minutes weekly as tolerated Starting Trulicity for type 2 diabetes

## 2023-09-02 LAB — BASIC METABOLIC PANEL WITH GFR
BUN/Creatinine Ratio: 10 (ref 9–20)
BUN: 10 mg/dL (ref 6–24)
CO2: 23 mmol/L (ref 20–29)
Calcium: 9.7 mg/dL (ref 8.7–10.2)
Chloride: 101 mmol/L (ref 96–106)
Creatinine, Ser: 0.98 mg/dL (ref 0.76–1.27)
Glucose: 197 mg/dL — ABNORMAL HIGH (ref 70–99)
Potassium: 4.2 mmol/L (ref 3.5–5.2)
Sodium: 137 mmol/L (ref 134–144)
eGFR: 99 mL/min/{1.73_m2} (ref >59)

## 2023-09-02 LAB — LDL CHOLESTEROL, DIRECT: LDL Direct: 72 mg/dL (ref 0–99)

## 2023-09-04 ENCOUNTER — Ambulatory Visit: Payer: Self-pay | Admitting: Nurse Practitioner

## 2023-09-07 ENCOUNTER — Telehealth: Payer: Self-pay

## 2023-09-07 ENCOUNTER — Other Ambulatory Visit (HOSPITAL_COMMUNITY): Payer: Self-pay

## 2023-09-07 NOTE — Telephone Encounter (Signed)
 Pharmacy Patient Advocate Encounter   Received notification from CoverMyMeds that prior authorization for TRULICITY  0.25MG  is required/requested.   Insurance verification completed.   The patient is insured through [facility] [place] .   PA required; PA submitted to above mentioned insurance via CoverMyMeds Key/confirmation #/EOC [employer]. Status is pending

## 2023-09-08 NOTE — Telephone Encounter (Signed)
 Pharmacy Patient Advocate Encounter  Received notification from [facility] [place] [employer] that Prior Authorization for TRULICITY  0.75MG  has been APPROVED from 08/24/23 to 09/06/24   PA #/Case ID/Reference #: [id number]

## 2023-09-20 ENCOUNTER — Ambulatory Visit (INDEPENDENT_AMBULATORY_CARE_PROVIDER_SITE_OTHER): Admitting: Podiatry

## 2023-09-20 DIAGNOSIS — M7661 Achilles tendinitis, right leg: Secondary | ICD-10-CM

## 2023-09-20 NOTE — Progress Notes (Signed)
 Subjective:  Patient ID: Michael Bauer, male    DOB: 08-Jun-1980,  MRN: [id number]  Chief Complaint  Patient presents with   Foot Pain    Rt heel pain     43 y.o. male presents with the above complaint.  Patient presents with right Achilles tendinitis insertional pain.  He states it started coming out of nowhere has progressed gotten worse worse with ambulation or shoe pressure has not seen MRIs prior to seeing me.  He denies any other acute complaints he has been going on for quite some time.  He is a diabetic   Review of Systems: Negative except as noted in the HPI. Denies N/V/F/Ch.  Past Medical History:  Diagnosis Date   Cocaine use disorder (HCC)    DDD (degenerative disc disease), lumbar    Diabetes mellitus without complication (HCC)    Hyperlipidemia    Hypertension    Marijuana abuse    Obesity    Tobacco use disorder     Current Outpatient Medications:    Accu-Chek Softclix Lancets lancets, Use as instructed for twice daily fingerstick blood glucose checks. (Patient not taking: Reported on 11/16/2022), Disp: 200 each, Rfl: 0   ALEVAZOL 1 % OINT, APPLY TO AFFECTED AREA TWICE A DAY (Patient not taking: Reported on 09/01/2023), Disp: 56.7 g, Rfl: 0   atorvastatin  (LIPITOR) 40 MG tablet, Take 1 tablet (40 mg total) by mouth at bedtime., Disp: 90 tablet, Rfl: 1   Blood Glucose Monitoring Suppl (ACCU-CHEK GUIDE) w/Device KIT, Use twice daily for fingerstick blood glucose monitoring (Patient not taking: Reported on 11/16/2022), Disp: 1 kit, Rfl: 0   cyclobenzaprine  (FLEXERIL ) 5 MG tablet, Take 1 tablet (5 mg total) by mouth 3 (three) times daily as needed for muscle spasms., Disp: 30 tablet, Rfl: 0   Dulaglutide  (TRULICITY ) 0.75 MG/0.5ML SOAJ, Inject 0.75 mg into the skin once a week., Disp: 2 mL, Rfl: 0   glucose blood (ACCU-CHEK GUIDE) test strip, Use as instructed for twice daily fingerstick blood glucose checks. (Patient not taking: Reported on 11/16/2022), Disp: 200 each, Rfl:  0   ibuprofen  (ADVIL ) 800 MG tablet, Take 1 tablet (800 mg total) by mouth every 8 (eight) hours as needed., Disp: 30 tablet, Rfl: 0   lisinopril  (ZESTRIL ) 2.5 MG tablet, Take 1 tablet (2.5 mg total) by mouth daily., Disp: 90 tablet, Rfl: 1  Social History   Tobacco Use  Smoking Status Every Day   Current packs/day: 1.00   Average packs/day: 1 pack/day for 20.0 years (20.0 ttl pk-yrs)   Types: Cigarettes  Smokeless Tobacco Never    Allergies  Allergen Reactions   Metformin  And Related     Abdominal pain   Objective:  There were no vitals filed for this visit. There is no height or weight on file to calculate BMI. Constitutional Well developed. Well nourished.  Vascular Dorsalis pedis pulses palpable bilaterally. Posterior tibial pulses palpable bilaterally. Capillary refill normal to all digits.  No cyanosis or clubbing noted. Pedal hair growth normal.  Neurologic Normal speech. Oriented to person, place, and time. Epicritic sensation to light touch grossly present bilaterally.  Dermatologic Nails well groomed and normal in appearance. No open wounds. No skin lesions.  Orthopedic: Pain on palpation of right Achilles insertion.  Pain with dorsiflexion of the ankle joint no pain with plantarflexion of the ankle joint positive Haglund's deformity noted positive Silfverskiold test noted with gastrocnemius equinus.   Radiographs: None Assessment:  No diagnosis found. Plan:  Patient was evaluated and  treated and all questions answered.  Right Achilles tendinitis - All questions and concerns were discussed with the patient in extensive detail given the amount of pain that he is experiencing he will benefit from cam boot immobilization Cam boot was dispensed he will wear the boot for next 4 weeks  No follow-ups on file.

## 2023-10-11 ENCOUNTER — Ambulatory Visit: Payer: Self-pay | Admitting: Nurse Practitioner

## 2023-10-12 NOTE — Telephone Encounter (Signed)
 Called patient to reschedule his missed appt. Left voice mail

## 2023-10-20 ENCOUNTER — Other Ambulatory Visit: Payer: Self-pay | Admitting: Nurse Practitioner

## 2023-10-20 DIAGNOSIS — E1165 Type 2 diabetes mellitus with hyperglycemia: Secondary | ICD-10-CM

## 2023-11-29 ENCOUNTER — Ambulatory Visit (INDEPENDENT_AMBULATORY_CARE_PROVIDER_SITE_OTHER): Admitting: Nurse Practitioner

## 2023-11-29 ENCOUNTER — Encounter: Payer: Self-pay | Admitting: Nurse Practitioner

## 2023-11-29 VITALS — BP 121/72 | HR 94 | Wt 242.0 lb

## 2023-11-29 DIAGNOSIS — Z1321 Encounter for screening for nutritional disorder: Secondary | ICD-10-CM

## 2023-11-29 DIAGNOSIS — E785 Hyperlipidemia, unspecified: Secondary | ICD-10-CM | POA: Diagnosis not present

## 2023-11-29 DIAGNOSIS — Z130 Encounter for screening for diseases of the blood and blood-forming organs and certain disorders involving the immune mechanism: Secondary | ICD-10-CM

## 2023-11-29 DIAGNOSIS — E1165 Type 2 diabetes mellitus with hyperglycemia: Secondary | ICD-10-CM

## 2023-11-29 DIAGNOSIS — Z13228 Encounter for screening for other metabolic disorders: Secondary | ICD-10-CM

## 2023-11-29 DIAGNOSIS — Z1329 Encounter for screening for other suspected endocrine disorder: Secondary | ICD-10-CM | POA: Diagnosis not present

## 2023-11-29 LAB — POCT GLYCOSYLATED HEMOGLOBIN (HGB A1C): Hemoglobin A1C: 10.3 % — AB (ref 4.0–5.6)

## 2023-11-29 MED ORDER — ATORVASTATIN CALCIUM 40 MG PO TABS: 40.0000 mg | ORAL_TABLET | Freq: Every day | ORAL | 1 refills | Status: AC

## 2023-11-29 MED ORDER — TRULICITY 0.75 MG/0.5ML ~~LOC~~ SOAJ: 0.7500 mg | SUBCUTANEOUS | 1 refills | Status: AC

## 2023-11-29 NOTE — Patient Instructions (Signed)
 1. Type 2 diabetes mellitus with hyperglycemia, without long-term current use of insulin  (HCC) (Primary)  - Microalbumin/Creatinine Ratio, Urine - POCT glycosylated hemoglobin (Hb A1C) - Dulaglutide  (TRULICITY ) 0.75 MG/0.5ML SOAJ; Inject 0.75 mg into the skin once a week.  Dispense: 2 mL; Refill: 1  2. Screening for endocrine, nutritional, metabolic and immunity disorder  - CBC  3. Dyslipidemia, goal LDL below 70  - Direct LDL - Lipid panel - atorvastatin  (LIPITOR) 40 MG tablet; Take 1 tablet (40 mg total) by mouth at bedtime.  Dispense: 90 tablet; Refill: 1    It is important that you exercise regularly at least 30 minutes 5 times a week as tolerated  Think about what you will eat, plan ahead. Choose  clean, [place], fresh or frozen over canned, processed or packaged foods which are more sugary, salty and fatty. 70 to 75% of food eaten should be vegetables and fruit. Three meals at set times with snacks allowed between meals, but they must be fruit or vegetables. Aim to eat over a 12 hour period , example 7 am to 7 pm, and STOP after  your last meal of the day. Drink water,generally about 64 ounces per day, no other drink is as healthy. Fruit juice is best enjoyed in a healthy way, by EATING the fruit.  Thanks for choosing [facility] we consider it a privelige to serve you.

## 2023-11-29 NOTE — Progress Notes (Signed)
 Established Patient Office Visit  Subjective:  Patient ID: Michael Bauer, male    DOB: 1980-08-06  Age: 43 y.o. MRN: [id number]  CC:  Chief Complaint  Patient presents with   Diabetes    HPI    Discussed the use of AI scribe software for clinical note transcription with the patient, who gave verbal consent to proceed.  History of Present Illness Michael Bauer is a 44 year old male  has a past medical history of Cocaine use disorder (HCC), DDD (degenerative disc disease), lumbar, Diabetes mellitus without complication (HCC), GERD (gastroesophageal reflux disease) (01/05/24), Hyperlipidemia, Hypertension, Marijuana abuse, Neuromuscular disorder (HCC), Obesity, and Tobacco use disorder.  who presents for a follow-up visit.  He has run out of his medications, including atorvastatin  40 mg daily and Trulicity  0.75mg  once weekly , after his last visit two months ago. He attempted to refill his prescriptions but was unsuccessful and mentioned sending a message via MyChart about a month ago without receiving a response. Consequently, he has not been taking his diabetes medication since then.  His A1c has increased from 7.2% two months ago to 10.3% currently. He does not follow a specific diet. No excessive urination or thirst. No fever, chills, chest pain, or shortness of breath.  He is allergic to metformin , which previously caused diarrhea.  Socially, he is stressed and inactive, stating, 'I don't do nothing. I don't get lazy, man.' He also reveals that he is going to prison either Friday or Monday, which he anticipates will be for four years.   Assessment and Plan Assessment & Plan     Past Medical History:  Diagnosis Date   Cocaine use disorder (HCC)    DDD (degenerative disc disease), lumbar    Diabetes mellitus without complication (HCC)    GERD (gastroesophageal reflux disease) 01/05/24   Hyperlipidemia    Hypertension    Marijuana abuse    Neuromuscular disorder  (HCC)    Obesity    Tobacco use disorder     Past Surgical History:  Procedure Laterality Date   NO PAST SURGERIES      Family History  Problem Relation Age of Onset   Hypertension Mother    Diabetes Mother    Heart attack Neg Hx    Stroke Neg Hx     Social History   Socioeconomic History   Marital status: Single    Spouse name: Not on file   Number of children: 1   Years of education: Not on file   Highest education level: GED or equivalent  Occupational History   Not on file  Tobacco Use   Smoking status: Every Day    Current packs/day: 1.00    Average packs/day: 1 pack/day for 25.0 years (25.0 ttl pk-yrs)    Types: Cigarettes   Smokeless tobacco: Never  Vaping Use   Vaping status: Never Used  Substance and Sexual Activity   Alcohol use: Yes    Comment: Occasionally    Drug use: Yes    Types: Marijuana, Cocaine   Sexual activity: Yes  Other Topics Concern   Not on file  Social History Narrative   Lives with his girlfriend    Social Drivers of Health   Financial Resource Strain: Medium Risk (10/10/2023)   Overall Financial Resource Strain (CARDIA)    Difficulty of Paying Living Expenses: Somewhat hard  Food Insecurity: Food Insecurity Present (10/10/2023)   Hunger Vital Sign    Worried About Running Out of Food in the  Last Year: Sometimes true    Ran Out of Food in the Last Year: Never true  Transportation Needs: Unmet Transportation Needs (10/10/2023)   PRAPARE - Transportation    Lack of Transportation (Medical): Yes    Lack of Transportation (Non-Medical): Yes  Physical Activity: Inactive (10/10/2023)   Exercise Vital Sign    Days of Exercise per Week: 0 days    Minutes of Exercise per Session: Not on file  Stress: Stress Concern Present (10/10/2023)   [employer] of Occupational Health - Occupational Stress Questionnaire    Feeling of Stress: To some extent  Social Connections: Moderately Integrated (10/10/2023)   Social Connection and  Isolation Panel    Frequency of Communication with Friends and Family: More than three times a week    Frequency of Social Gatherings with Friends and Family: More than three times a week    Attends Religious Services: More than 4 times per year    Active Member of [employer] or Organizations: Yes    Attends Banker Meetings: 1 to 4 times per year    Marital Status: Never married  Intimate Partner Violence: Not on file    Outpatient Medications Prior to Visit  Medication Sig Dispense Refill   lisinopril  (ZESTRIL ) 2.5 MG tablet Take 1 tablet (2.5 mg total) by mouth daily. 90 tablet 1   Accu-Chek Softclix Lancets lancets Use as instructed for twice daily fingerstick blood glucose checks. (Patient not taking: Reported on 11/29/2023) 200 each 0   ALEVAZOL 1 % OINT APPLY TO AFFECTED AREA TWICE A DAY (Patient not taking: Reported on 11/29/2023) 56.7 g 0   Blood Glucose Monitoring Suppl (ACCU-CHEK GUIDE) w/Device KIT Use twice daily for fingerstick blood glucose monitoring (Patient not taking: Reported on 11/29/2023) 1 kit 0   cyclobenzaprine  (FLEXERIL ) 5 MG tablet Take 1 tablet (5 mg total) by mouth 3 (three) times daily as needed for muscle spasms. (Patient not taking: Reported on 11/29/2023) 30 tablet 0   glucose blood (ACCU-CHEK GUIDE) test strip Use as instructed for twice daily fingerstick blood glucose checks. (Patient not taking: Reported on 11/16/2022) 200 each 0   ibuprofen  (ADVIL ) 800 MG tablet Take 1 tablet (800 mg total) by mouth every 8 (eight) hours as needed. (Patient not taking: Reported on 11/29/2023) 30 tablet 0   atorvastatin  (LIPITOR) 40 MG tablet Take 1 tablet (40 mg total) by mouth at bedtime. (Patient not taking: Reported on 11/29/2023) 90 tablet 1   Dulaglutide  (TRULICITY ) 0.75 MG/0.5ML SOAJ INJECT 0.75 MG SUBCUTANEOUSLY ONE TIME PER WEEK (Patient not taking: Reported on 11/29/2023) 0.5 mL 1   No facility-administered medications prior to visit.    Allergies  Allergen Reactions    Metformin  And Related     Abdominal pain    ROS Review of Systems  Constitutional:  Negative for appetite change, chills, fatigue and fever.  HENT:  Negative for congestion, postnasal drip, rhinorrhea and sneezing.   Respiratory:  Negative for cough, shortness of breath and wheezing.   Cardiovascular:  Negative for chest pain, palpitations and leg swelling.  Gastrointestinal:  Negative for abdominal pain, constipation, nausea and vomiting.  Genitourinary:  Negative for difficulty urinating, dysuria, flank pain and frequency.  Musculoskeletal:  Negative for arthralgias, back pain, joint swelling and myalgias.  Skin:  Negative for color change, pallor, rash and wound.  Neurological:  Negative for dizziness, facial asymmetry, weakness, numbness and headaches.  Psychiatric/Behavioral:  Negative for behavioral problems, confusion, self-injury and suicidal ideas.  Objective:    Physical Exam Vitals and nursing note reviewed.  Constitutional:      General: He is not in acute distress.    Appearance: Normal appearance. He is obese. He is not ill-appearing, toxic-appearing or diaphoretic.  Eyes:     General: No scleral icterus.       Right eye: No discharge.        Left eye: No discharge.     Extraocular Movements: Extraocular movements intact.     Conjunctiva/sclera: Conjunctivae normal.  Cardiovascular:     Rate and Rhythm: Normal rate and regular rhythm.     Pulses: Normal pulses.     Heart sounds: Normal heart sounds. No murmur heard.    No friction rub. No gallop.  Pulmonary:     Effort: Pulmonary effort is normal. No respiratory distress.     Breath sounds: Normal breath sounds. No stridor. No wheezing, rhonchi or rales.  Chest:     Chest wall: No tenderness.  Abdominal:     General: There is no distension.     Palpations: Abdomen is soft.     Tenderness: There is no abdominal tenderness. There is no right CVA tenderness, left CVA tenderness or guarding.   Musculoskeletal:        General: No swelling, tenderness, deformity or signs of injury.     Right lower leg: No edema.     Left lower leg: No edema.  Skin:    General: Skin is warm and dry.     Capillary Refill: Capillary refill takes 2 to 3 seconds.     Coloration: Skin is not jaundiced or pale.     Findings: No bruising, erythema or lesion.  Neurological:     Mental Status: He is alert and oriented to person, place, and time.     Motor: No weakness.     Gait: Gait normal.  Psychiatric:        Mood and Affect: Mood normal.        Behavior: Behavior normal.        Thought Content: Thought content normal.        Judgment: Judgment normal.   1  BP 121/72   Pulse 94   Wt 242 lb (109.8 kg)   SpO2 98%   BMI 35.74 kg/m  Wt Readings from Last 3 Encounters:  11/29/23 242 lb (109.8 kg)  09/01/23 251 lb (113.9 kg)  05/17/23 245 lb (111.1 kg)    No results found for: TSH Lab Results  Component Value Date   WBC 8.8 11/16/2022   HGB 15.5 11/16/2022   HCT 48.3 11/16/2022   MCV 86 11/16/2022   PLT 326 11/16/2022   Lab Results  Component Value Date   NA 137 09/01/2023   K 4.2 09/01/2023   CO2 23 09/01/2023   GLUCOSE 197 (H) 09/01/2023   BUN 10 09/01/2023   CREATININE 0.98 09/01/2023   BILITOT 0.2 11/16/2022   ALKPHOS 62 11/16/2022   AST 20 11/16/2022   ALT 26 11/16/2022   PROT 7.5 11/16/2022   ALBUMIN 4.6 11/16/2022   CALCIUM  9.7 09/01/2023   ANIONGAP 10 03/03/2022   EGFR 99 09/01/2023   Lab Results  Component Value Date   CHOL 129 03/01/2023   Lab Results  Component Value Date   HDL 31 (L) 03/01/2023   Lab Results  Component Value Date   LDLCALC 71 03/01/2023   Lab Results  Component Value Date   TRIG 156 (H) 03/01/2023   Lab  Results  Component Value Date   CHOLHDL 4.2 03/01/2023   Lab Results  Component Value Date   HGBA1C 10.3 (A) 11/29/2023      Assessment & Plan:   Problem List Items Addressed This Visit       Endocrine   Type 2  diabetes mellitus with hyperglycemia, without long-term current use of insulin  (HCC) - Primary   Lab Results  Component Value Date   HGBA1C 10.3 (A) 11/29/2023   Poorly controlled with A1c of 10.3, increased from 7.2. Non-adherence to Trulicity , diabetic diet, and exercise. Risk of retinopathy, kidney disease, and stroke if uncontrolled. Trulicity  was last refilled on 10/20/2023 but the medication was not picked up from the pharmacy - Refill Trulicity  0.75 mg once weekly prescription to [facility]. - Encouraged adherence to diabetic diet, avoiding sugar, sweets, soda, and high carbohydrate foods. - Recommended water instead of juice or soda. - Encouraged moderate to vigorous exercise for 30 minutes, five days a week as tolerated. No follow-up appointments made today since the patient will be going to the prison this week , encouraged to take his after visit summary with him so did not know what medication was prescribed      Relevant Medications   atorvastatin  (LIPITOR) 40 MG tablet   Dulaglutide  (TRULICITY ) 0.75 MG/0.5ML SOAJ   Other Relevant Orders   Microalbumin/Creatinine Ratio, Urine   POCT glycosylated hemoglobin (Hb A1C) (Completed)     Other   Dyslipidemia, goal LDL below 70   Atorvastatin  40 mg daily refilled      Relevant Medications   atorvastatin  (LIPITOR) 40 MG tablet   Other Relevant Orders   Direct LDL   Lipid panel   Other Visit Diagnoses       Screening for endocrine, nutritional, metabolic and immunity disorder       Relevant Orders   CBC       Meds ordered this encounter  Medications   atorvastatin  (LIPITOR) 40 MG tablet    Sig: Take 1 tablet (40 mg total) by mouth at bedtime.    Dispense:  90 tablet    Refill:  1   Dulaglutide  (TRULICITY ) 0.75 MG/0.5ML SOAJ    Sig: Inject 0.75 mg into the skin once a week.    Dispense:  2 mL    Refill:  1    DX Code Needed  .    Follow-up: No follow-ups on file.    Corben Auzenne R Virjean Boman, FNP

## 2023-11-29 NOTE — Assessment & Plan Note (Addendum)
 Lab Results  Component Value Date   HGBA1C 10.3 (A) 11/29/2023   Poorly controlled with A1c of 10.3, increased from 7.2. Non-adherence to Trulicity , diabetic diet, and exercise. Risk of retinopathy, kidney disease, and stroke if uncontrolled. Trulicity  was last refilled on 10/20/2023 but the medication was not picked up from the pharmacy - Refill Trulicity  0.75 mg once weekly prescription to [facility]. - Encouraged adherence to diabetic diet, avoiding sugar, sweets, soda, and high carbohydrate foods. - Recommended water instead of juice or soda. - Encouraged moderate to vigorous exercise for 30 minutes, five days a week as tolerated. No follow-up appointments made today since the patient will be going to the prison this week , encouraged to take his after visit summary with him so did not know what medication was prescribed

## 2023-11-29 NOTE — Assessment & Plan Note (Signed)
Atorvastatin 40 mg daily refilled.

## 2023-11-30 LAB — LIPID PANEL
Chol/HDL Ratio: 5 ratio (ref 0.0–5.0)
Cholesterol, Total: 150 mg/dL (ref 100–199)
HDL: 30 mg/dL — ABNORMAL LOW (ref >39)
LDL Chol Calc (NIH): 61 mg/dL (ref 0–99)
Triglycerides: 381 mg/dL — ABNORMAL HIGH (ref 0–149)
VLDL Cholesterol Cal: 59 mg/dL — ABNORMAL HIGH (ref 5–40)

## 2023-11-30 LAB — LDL CHOLESTEROL, DIRECT: LDL Direct: 59 mg/dL (ref 0–99)

## 2023-11-30 LAB — CBC
Hematocrit: 44 % (ref 37.5–51.0)
Hemoglobin: 14.3 g/dL (ref 13.0–17.7)
MCH: 27.9 pg (ref 26.6–33.0)
MCHC: 32.5 g/dL (ref 31.5–35.7)
MCV: 86 fL (ref 79–97)
Platelets: 347 x10E3/uL (ref 150–450)
RBC: 5.12 x10E6/uL (ref 4.14–5.80)
RDW: 12.1 % (ref 11.6–15.4)
WBC: 10.4 x10E3/uL (ref 3.4–10.8)

## 2023-11-30 LAB — MICROALBUMIN / CREATININE URINE RATIO
Creatinine, Urine: 137.4 mg/dL
Microalb/Creat Ratio: 22 mg/g{creat} (ref 0–29)
Microalbumin, Urine: 30.9 ug/mL

## 2023-12-01 ENCOUNTER — Ambulatory Visit: Payer: Self-pay | Admitting: Nurse Practitioner

## 2024-01-29 ENCOUNTER — Encounter: Payer: Self-pay | Admitting: Radiology
